# Patient Record
Sex: Female | Born: 1995 | Race: White | Hispanic: No | Marital: Single | Smoking: Never smoker
Health system: Southern US, Community
[De-identification: ages and names within clinical notes are randomized; demographics above are authoritative.]

## PROBLEM LIST (undated history)

## (undated) ENCOUNTER — Inpatient Hospital Stay: Payer: Self-pay

## (undated) DIAGNOSIS — D649 Anemia, unspecified: Secondary | ICD-10-CM

## (undated) DIAGNOSIS — F419 Anxiety disorder, unspecified: Secondary | ICD-10-CM

## (undated) DIAGNOSIS — F410 Panic disorder [episodic paroxysmal anxiety] without agoraphobia: Secondary | ICD-10-CM

## (undated) DIAGNOSIS — F1290 Cannabis use, unspecified, uncomplicated: Secondary | ICD-10-CM

## (undated) HISTORY — DX: Anxiety disorder, unspecified: F41.9

## (undated) HISTORY — DX: Panic disorder (episodic paroxysmal anxiety): F41.0

## (undated) HISTORY — PX: APPENDECTOMY: SHX54

## (undated) NOTE — *Deleted (*Deleted)
I value your feedback and entrusting us with your care. If you get a [facility] patient survey, I would appreciate you taking the time to let us know about your experience today. Thank you!  As of October 08, 2019, your lab results will be released to your MyChart immediately, before I even have a chance to see them. Please give me time to review them and contact you if there are any abnormalities. Thank you for your patience.  

---
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
## 2016-01-12 ENCOUNTER — Inpatient Hospital Stay: Payer: Medicaid Other | Admitting: Anesthesiology

## 2016-01-12 ENCOUNTER — Inpatient Hospital Stay
Admission: EM | Admit: 2016-01-12 | Discharge: 2016-01-14 | DRG: 774 | Disposition: A | Discharge status: Home / Self Care | Payer: Medicaid Other | Attending: Obstetrics and Gynecology | Admitting: Obstetrics and Gynecology

## 2016-01-12 ENCOUNTER — Encounter: Payer: Self-pay | Admitting: *Deleted

## 2016-01-12 DIAGNOSIS — O360930 Maternal care for other rhesus isoimmunization, third trimester, not applicable or unspecified: Secondary | ICD-10-CM | POA: Diagnosis present

## 2016-01-12 DIAGNOSIS — O9912 Other diseases of the blood and blood-forming organs and certain disorders involving the immune mechanism complicating childbirth: Secondary | ICD-10-CM | POA: Diagnosis present

## 2016-01-12 DIAGNOSIS — F1290 Cannabis use, unspecified, uncomplicated: Secondary | ICD-10-CM | POA: Diagnosis present

## 2016-01-12 DIAGNOSIS — O9822 Gonorrhea complicating childbirth: Secondary | ICD-10-CM | POA: Diagnosis present

## 2016-01-12 DIAGNOSIS — O99324 Drug use complicating childbirth: Secondary | ICD-10-CM | POA: Diagnosis present

## 2016-01-12 DIAGNOSIS — D62 Acute posthemorrhagic anemia: Secondary | ICD-10-CM | POA: Diagnosis not present

## 2016-01-12 DIAGNOSIS — O0933 Supervision of pregnancy with insufficient antenatal care, third trimester: Secondary | ICD-10-CM | POA: Diagnosis not present

## 2016-01-12 DIAGNOSIS — O9081 Anemia of the puerperium: Secondary | ICD-10-CM | POA: Diagnosis not present

## 2016-01-12 DIAGNOSIS — Z3A39 39 weeks gestation of pregnancy: Secondary | ICD-10-CM | POA: Diagnosis not present

## 2016-01-12 DIAGNOSIS — D6959 Other secondary thrombocytopenia: Secondary | ICD-10-CM | POA: Diagnosis present

## 2016-01-12 DIAGNOSIS — O98213 Gonorrhea complicating pregnancy, third trimester: Secondary | ICD-10-CM | POA: Diagnosis present

## 2016-01-12 LAB — CBC
HCT: 27.7 % — ABNORMAL LOW (ref 35.0–47.0)
Hemoglobin: 9.4 g/dL — ABNORMAL LOW (ref 12.0–16.0)
MCH: 28.7 pg (ref 26.0–34.0)
MCHC: 34.1 g/dL (ref 32.0–36.0)
MCV: 84.3 fL (ref 80.0–100.0)
Platelets: 123 10*3/uL — ABNORMAL LOW (ref 150–440)
RBC: 3.28 MIL/uL — ABNORMAL LOW (ref 3.80–5.20)
RDW: 13.3 % (ref 11.5–14.5)
WBC: 11.1 10*3/uL — ABNORMAL HIGH (ref 3.6–11.0)

## 2016-01-12 LAB — URINE DRUG SCREEN, QUALITATIVE (ARMC ONLY)
Amphetamines, Ur Screen: NOT DETECTED
Barbiturates, Ur Screen: NOT DETECTED
Benzodiazepine, Ur Scrn: NOT DETECTED
Cannabinoid 50 Ng, Ur ~~LOC~~: NOT DETECTED
Cocaine Metabolite,Ur ~~LOC~~: NOT DETECTED
MDMA (Ecstasy)Ur Screen: NOT DETECTED
Methadone Scn, Ur: NOT DETECTED
Opiate, Ur Screen: NOT DETECTED
Phencyclidine (PCP) Ur S: NOT DETECTED
Tricyclic, Ur Screen: NOT DETECTED

## 2016-01-12 LAB — CHLAMYDIA/NGC RT PCR (ARMC ONLY)
Chlamydia Tr: NOT DETECTED
N gonorrhoeae: DETECTED — AB

## 2016-01-12 MED ORDER — TERBUTALINE SULFATE 1 MG/ML IJ SOLN: 0.2500 mg | Freq: Once | INTRAMUSCULAR | Status: DC | PRN

## 2016-01-12 MED ORDER — OXYTOCIN 10 UNIT/ML IJ SOLN
INTRAMUSCULAR | Status: AC
  Filled 2016-01-12: qty 2

## 2016-01-12 MED ORDER — LIDOCAINE HCL (PF) 1 % IJ SOLN
INTRAMUSCULAR | Status: AC
  Filled 2016-01-12: qty 30

## 2016-01-12 MED ORDER — BUPIVACAINE HCL (PF) 0.25 % IJ SOLN
INTRAMUSCULAR | Status: DC | PRN
  Administered 2016-01-12: 5 mL via EPIDURAL

## 2016-01-12 MED ORDER — LACTATED RINGERS IV SOLN
500.0000 mL | INTRAVENOUS | Status: DC | PRN
  Administered 2016-01-12: 500 mL via INTRAVENOUS

## 2016-01-12 MED ORDER — BUTORPHANOL TARTRATE 1 MG/ML IJ SOLN
1.0000 mg | INTRAMUSCULAR | Status: DC | PRN
  Administered 2016-01-12: 1 mg via INTRAVENOUS
  Filled 2016-01-12: qty 1

## 2016-01-12 MED ORDER — OXYTOCIN 40 UNITS IN LACTATED RINGERS INFUSION - SIMPLE MED
2.5000 [IU]/h | INTRAVENOUS | Status: DC
  Filled 2016-01-12: qty 1000

## 2016-01-12 MED ORDER — ONDANSETRON HCL 4 MG/2ML IJ SOLN: 4.0000 mg | Freq: Four times a day (QID) | INTRAMUSCULAR | Status: DC | PRN

## 2016-01-12 MED ORDER — SODIUM CHLORIDE FLUSH 0.9 % IV SOLN
INTRAVENOUS | Status: AC
  Filled 2016-01-12: qty 10

## 2016-01-12 MED ORDER — LACTATED RINGERS IV SOLN
INTRAVENOUS | Status: DC
  Administered 2016-01-12: 17:00:00 via INTRAVENOUS

## 2016-01-12 MED ORDER — OXYTOCIN 40 UNITS IN LACTATED RINGERS INFUSION - SIMPLE MED
1.0000 m[IU]/min | INTRAVENOUS | Status: DC
  Administered 2016-01-12: 1 m[IU]/min via INTRAVENOUS

## 2016-01-12 MED ORDER — OXYTOCIN BOLUS FROM INFUSION
500.0000 mL | INTRAVENOUS | Status: DC
  Administered 2016-01-12: 500 mL via INTRAVENOUS

## 2016-01-12 MED ORDER — MISOPROSTOL 200 MCG PO TABS
ORAL_TABLET | ORAL | Status: AC
  Filled 2016-01-12: qty 4

## 2016-01-12 MED ORDER — FENTANYL 2.5 MCG/ML W/ROPIVACAINE 0.2% IN NS 100 ML EPIDURAL INFUSION (ARMC-ANES)
EPIDURAL | Status: AC
  Administered 2016-01-12: 10 mL/h via EPIDURAL
  Filled 2016-01-12: qty 100

## 2016-01-12 MED ORDER — CEFTRIAXONE SODIUM 250 MG IJ SOLR
250.0000 mg | Freq: Once | INTRAMUSCULAR | Status: AC
  Administered 2016-01-12: 250 mg via INTRAMUSCULAR
  Filled 2016-01-12: qty 250

## 2016-01-12 MED ORDER — AMMONIA AROMATIC IN INHA
RESPIRATORY_TRACT | Status: AC
  Filled 2016-01-12: qty 10

## 2016-01-12 MED ORDER — ACETAMINOPHEN 325 MG PO TABS
650.0000 mg | ORAL_TABLET | ORAL | Status: DC | PRN
Start: 2016-01-12 — End: 2016-01-13

## 2016-01-12 MED ORDER — AZITHROMYCIN 250 MG PO TABS
1000.0000 mg | ORAL_TABLET | Freq: Once | ORAL | Status: AC
  Administered 2016-01-12: 1000 mg via ORAL
  Filled 2016-01-12: qty 4

## 2016-01-12 MED ORDER — LIDOCAINE HCL (PF) 1 % IJ SOLN: 30.0000 mL | INTRAMUSCULAR | Status: DC | PRN

## 2016-01-12 NOTE — Discharge Instructions (Addendum)
° °Vaginal Delivery, Care After °Refer to this sheet in the next few weeks. These discharge instructions provide you with information on caring for yourself after delivery. Your caregiver may also give you specific instructions. Your treatment has been planned according to the most current medical practices available, but problems sometimes occur. Call your caregiver if you have any problems or questions after you go home. °HOME CARE INSTRUCTIONS °1. Take over-the-counter or prescription medicines only as directed by your caregiver or pharmacist. °2. Do not drink alcohol, especially if you are breastfeeding or taking medicine to relieve pain. °3. Do not smoke tobacco. °4. Continue to use good perineal care. Good perineal care includes: °1. Wiping your perineum from back to front °2. Keeping your perineum clean. °3. You can do sitz baths twice a day, to help keep this area clean °5. Do not use tampons, douche or have sex until your caregiver says it is okay. °6. Shower only and avoid sitting in submerged water, aside from sitz baths °7. Wear a well-fitting bra that provides breast support. °8. Eat healthy foods. °9. Drink enough fluids to keep your urine clear or pale yellow. °10. Eat high-fiber foods such as whole grain cereals and breads, brown rice, beans, and fresh fruits and vegetables every day. These foods may help prevent or relieve constipation. °11. Avoid constipation with high fiber foods or medications, such as miralax or metamucil °12. Follow your caregiver's recommendations regarding resumption of activities such as climbing stairs, driving, lifting, exercising, or traveling. °13. Talk to your caregiver about resuming sexual activities. Resumption of sexual activities is dependent upon your risk of infection, your rate of healing, and your comfort and desire to resume sexual activity. °14. Try to have someone help you with your household activities and your newborn for at least a few days after you  leave the hospital. °15. Rest as much as possible. Try to rest or take a nap when your newborn is sleeping. °16. Increase your activities gradually. °17. Keep all of your scheduled postpartum appointments. It is very important to keep your scheduled follow-up appointments. At these appointments, your caregiver will be checking to make sure that you are healing physically and emotionally. °SEEK MEDICAL CARE IF:  °· You are passing large clots from your vagina. Save any clots to show your caregiver. °· You have a foul smelling discharge from your vagina. °· You have trouble urinating. °· You are urinating frequently. °· You have pain when you urinate. °· You have a change in your bowel movements. °· You have increasing redness, pain, or swelling near your vaginal incision (episiotomy) or vaginal tear. °· You have pus draining from your episiotomy or vaginal tear. °· Your episiotomy or vaginal tear is separating. °· You have painful, hard, or reddened breasts. °· You have a severe headache. °· You have blurred vision or see spots. °· You feel sad or depressed. °· You have thoughts of hurting yourself or your newborn. °· You have questions about your care, the care of your newborn, or medicines. °· You are dizzy or light-headed. °· You have a rash. °· You have nausea or vomiting. °· You were breastfeeding and have not had a menstrual period within 12 weeks after you stopped breastfeeding. °· You are not breastfeeding and have not had a menstrual period by the 12th week after delivery. °· You have a fever. °SEEK IMMEDIATE MEDICAL CARE IF:  °· You have persistent pain. °· You have chest pain. °· You have shortness of breath. °·   You faint. °· You have leg pain. °· You have stomach pain. °· Your vaginal bleeding saturates two or more sanitary pads in 1 hour. °MAKE SURE YOU:  °· Understand these instructions. °· Will watch your condition. °· Will get help right away if you are not doing well or get worse. °Document Released:  10/12/2000 Document Revised: 03/01/2014 Document Reviewed: 06/11/2012 °ExitCare® Patient Information ©2015 [employer]. This information is not intended to replace advice given to you by your health care provider. Make sure you discuss any questions you have with your health care provider. ° °Sitz Bath °A sitz bath is a warm water bath taken in the sitting position. The water covers only the hips and butt (buttocks). We recommend using one that fits in the toilet, to help with ease of use and cleanliness. It may be used for either healing or cleaning purposes. Sitz baths are also used to relieve pain, itching, or muscle tightening (spasms). The water may contain medicine. Moist heat will help you heal and relax.  °HOME CARE  °Take 3 to 4 sitz baths a day. °18. Fill the bathtub half-full with warm water. °19. Sit in the water and open the drain a little. °20. Turn on the warm water to keep the tub half-full. Keep the water running constantly. °21. Soak in the water for 15 to 20 minutes. °22. After the sitz bath, pat the affected area dry. °GET HELP RIGHT AWAY IF: °You get worse instead of better. Stop the sitz baths if you get worse. °MAKE SURE YOU: °· Understand these instructions. °· Will watch your condition. °· Will get help right away if you are not doing well or get worse. °Document Released: 11/22/2004 Document Revised: 07/09/2012 Document Reviewed: 02/12/2011 °ExitCare® Patient Information ©2015 [employer]. This information is not intended to replace advice given to you by your health care provider. Make sure you discuss any questions you have with your health care provider. ° °Call your doctor for increased pain or vaginal bleeding, temperature above 100.4, depression, or concerns.  No strenuous activity or heavy lifting for 6 weeks.  No intercourse, tampons, douching, or enemas for 6 weeks.  No tub baths-showers only.  No driving for 2 weeks or while taking pain medications.  Continue prenatal vitamin  and iron.  Increase calories and fluids while breastfeeding.   °

## 2016-01-12 NOTE — OB Triage Note (Signed)
Pt. C/O SROM @ approx 0230/0300 this am. Reports clear fluid continuing to leak. Contractions started approx. 30 minutes after leaking of fluid began. Elaina HoopsElks, Shakira Los S

## 2016-01-12 NOTE — Discharge Summary (Signed)
Obstetrical Discharge Summary  Date of Admission: 01/12/2016 Date of Discharge: 01/14/2016  Primary OB: [facility]  Gestational Age at Delivery: [id number]   Antepartum complications: Rh negative, insufficient prenatal care, gonorrhea diagnosed on admission, history of THC use, anemia, gestational thrombocytopenia Reason for Admission: SROM  Date of Delivery: 01/12/2016  Delivered By: Cornelia Copaharlie Pickens, Jr MD Delivery Type: vacuum, outlet Intrapartum complications/course: late decelerations with pushing Anesthesia: epidural Placenta: Delivered and expressed via active management. Intact: yes. To pathology: yes.  Laceration: Perineum intact. Bilateral skid marks on inner labia near urethra; left one repaired with 3-0 plain gut Episiotomy: none Baby: Liveborn female, APGARs 8/9, weight 3230 g.   Discharge Diagnosis: Delivered. Same.  Postpartum course: Routine Discharge Vital Signs:  Current Vital Signs 24h Vital Sign Ranges  T 98.3 F (36.8 C) Temp  Avg: 98.4 F (36.9 C)  Min: 98.2 F (36.8 C)  Max: 98.7 F (37.1 C)  BP 116/82 mmHg BP  Min: 113/75  Max: 117/70  HR 68 Pulse  Avg: 80.4  Min: 68  Max: 94  RR 14 Resp  Avg: 17.6  Min: 14  Max: 20  SaO2 100 % Not Delivered SpO2  Avg: 100 %  Min: 100 %  Max: 100 %       24 Hour I/O Current Shift I/O  Time Ins Outs 03/17 0701 - 03/18 0700 In: 240 [P.O.:240] Out: 700 [Urine:700]     Patient Vitals for the past 6 hrs:  BP Temp Temp src Pulse Resp SpO2  01/14/16 0906 116/82 mmHg 98.3 F (36.8 C) Oral 68 14 100 %    Discharge Exam:  NAD Perineum: intact Abdomen: firm fundus below the umbilicus.  RRR no MRGs CTAB Ext: no c/c/e  Disposition: Home  Rh Immune globulin given: not applicable Rubella vaccine given: not applicable Tdap vaccine given in AP or PP setting: yes  Contraception: Depo  Prenatal/Postnatal Panel: O NEG//Rubella Immune//Varicella Immune//RPR negative//HIV negative/HepB Surface Ag negative//pap not applicable due  to age//plans to breastfeed  Plan:  Natasha Porter was discharged to home in good condition. Follow-up appointment with Dr. Vergie LivingPickens in 4 weeks for a regular PP visit  Discharge Medications:   Medication List    STOP taking these medications        acetaminophen 500 MG tablet  Commonly known as:  TYLENOL     amoxicillin 500 MG tablet  Commonly known as:  AMOXIL     metroNIDAZOLE 500 MG tablet  Commonly known as:  FLAGYL      TAKE these medications        famotidine 20 MG tablet  Commonly known as:  PEPCID  Take 1 tablet (20 mg total) by mouth 2 (two) times daily as needed for heartburn or indigestion.     ferrous fumarate 325 (106 Fe) MG Tabs tablet  Commonly known as:  HEMOCYTE - 106 mg FE  Take 1 tablet (106 mg of iron total) by mouth 2 (two) times daily.     ibuprofen 600 MG tablet  Commonly known as:  ADVIL,MOTRIN  Take 1 tablet (600 mg total) by mouth every 4 (four) hours as needed for moderate pain.     multivitamin-prenatal 27-0.8 MG Tabs tablet  Take 1 tablet by mouth daily at 12 noon.

## 2016-01-12 NOTE — Anesthesia Procedure Notes (Signed)
Epidural Patient location during procedure: OB  Staffing Anesthesiologist: Natasha AddisonHOMAS, Natasha Porter Performed by: anesthesiologist   Preanesthetic Checklist Completed: patient identified, site marked, surgical consent, pre-op evaluation, timeout performed, IV checked, risks and benefits discussed and monitors and equipment checked  Epidural Patient position: sitting Prep: Betadine Patient monitoring: heart rate, continuous pulse ox and blood pressure Approach: midline Location: L4-L5 Injection technique: LOR saline  Needle:  Needle type: Tuohy  Needle gauge: 18 G Needle length: 9 cm and 9 Catheter type: closed end flexible Catheter size: 20 Guage Test dose: negative and 1.5% lidocaine with Epi 1:200 K  Assessment Sensory level: T10 Events: blood not aspirated, injection not painful, no injection resistance, negative IV test and no paresthesia  Additional Notes   Patient tolerated the insertion well without complications.1819 catheter in. 1820 test dose. 1822 bolus. 1827 infusion start.Reason for block:procedure for pain

## 2016-01-12 NOTE — H&P (Signed)
OB History & Physical   History of Present Illness:  Chief Complaint: IUP at 39 weeks 1 day, contractions and spontaneous rupture of membranes this morning at 0245  HPI:  Natasha Porter is a 20 y.o. G1P0 female at 31102w1d dated by LMP and confirmed by U/S.  Her pregnancy has been complicated by late entry to prenatal care, marijuana use.    She reports contractions.   She reports leakage of fluid.   She denies vaginal bleeding.   She reports fetal movement.    Maternal Medical History:   Past Medical History  Diagnosis Date  . Anemia   . Marijuana use     Past Surgical History  Procedure Laterality Date  . Appendectomy      No Known Allergies  Prior to Admission medications   Medication Sig Start Date End Date Taking? Authorizing Provider  acetaminophen (TYLENOL) 500 MG tablet Take 2 tablets (1,000 mg total) by mouth every 6 (six) hours as needed for fever or headache. 12/21/15   Elenora Fenderhelsea C Ward, MD  amoxicillin (AMOXIL) 500 MG tablet Take 500 mg by mouth 3 (three) times daily. Reported on 01/12/2016    Historical Provider, MD  famotidine (PEPCID) 20 MG tablet Take 1 tablet (20 mg total) by mouth 2 (two) times daily as needed for heartburn or indigestion. Patient not taking: Reported on 01/12/2016 12/21/15   Elenora Fenderhelsea C Ward, MD  ferrous fumarate (HEMOCYTE - 106 MG FE) 325 (106 Fe) MG TABS tablet Take 1 tablet (106 mg of iron total) by mouth 2 (two) times daily. 12/21/15   Chelsea C Ward, MD  metroNIDAZOLE (FLAGYL) 500 MG tablet Take 1 tablet (500 mg total) by mouth 2 (two) times daily. Patient not taking: Reported on 01/12/2016 12/21/15   Elenora Fenderhelsea C Ward, MD  Prenatal Vit-Fe Fumarate-FA (MULTIVITAMIN-PRENATAL) 27-0.8 MG TABS tablet Take 1 tablet by mouth daily at 12 noon.    Historical Provider, MD    OB History  Gravida Para Term Preterm AB SAB TAB Ectopic Multiple Living  1             # Outcome Date GA Lbr Len/2nd Weight Sex Delivery Anes PTL Lv  1 Current               Prenatal  care site: [facility]  Social History: She  reports that she has never smoked. She does not have any smokeless tobacco history on file. She reports that she uses illicit drugs (Marijuana). She reports that she does not drink alcohol.  Family History: family history is not on file.   Review of Systems: Negative x 10 systems reviewed except as noted in the HPI.    Physical Exam:  Vital Signs: BP 125/81 mmHg  Pulse 80  Temp(Src) 98.1 F (36.7 C) (Oral)  Resp 16  Ht 5\' 3"  (1.6 m)  Wt 69.854 kg (154 lb)  BMI 27.29 kg/m2 General: no acute distress.  HEENT: normocephalic, atraumatic Heart: regular rate & rhythm.  No murmurs/rubs/gallops Lungs: clear to auscultation bilaterally Abdomen: soft, gravid, non-tender;  EFW: 7 pounds Pelvic:   External: Normal external female genitalia  Cervix: Dilation: 3 / Effacement (%): 90 / Station: -1   ROM: + pooling; - nitrazine, fetal hair visualized at cervix Extremities: non-tender, symmetric, no edema bilaterally.  DTRs: 2+ Neurologic: Alert & oriented x 3.    Pertinent Results:  Prenatal Labs: Blood type/Rh O negative  Antibody screen negative  Rubella Immune  Varicella Immune    RPR Non  reactive  HBsAg negative  HIV negative  GC negative  Chlamydia negative  Genetic screening declined  1 hour GTT 126  3 hour GTT NA  GBS negative on 12/21/2015   Baseline FHR: 150 beats/min   Variability: moderate   Accelerations: present   Decelerations: absent Contractions: present frequency: every 3 minutes per pt report Overall assessment: Category I tracing   Assessment:  Natasha Porter is a 20 y.o. G1P0 female at [redacted]w[redacted]d with contractions and rupture of membranes.   Plan:  1. Admit to Labor & Delivery  2. CBC, T&S, UDS, Clrs, IVF 3. GBS negative.   4. Fetal well-being: Category I 5. Augment labor with pitocin 6. Epidural as desired  Anikin Prosser, CNM  This patient and plan were discussed with Dr Vergie Living 01/12/2016
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
## 2016-01-13 LAB — CBC
HCT: 21.9 % — ABNORMAL LOW (ref 35.0–47.0)
HCT: 21.7 % — ABNORMAL LOW (ref 35.0–47.0)
Hemoglobin: 7.5 g/dL — ABNORMAL LOW (ref 12.0–16.0)
Hemoglobin: 7.3 g/dL — ABNORMAL LOW (ref 12.0–16.0)
MCH: 28.6 pg (ref 26.0–34.0)
MCH: 28.6 pg (ref 26.0–34.0)
MCHC: 34.2 g/dL (ref 32.0–36.0)
MCHC: 33.6 g/dL (ref 32.0–36.0)
MCV: 83.5 fL (ref 80.0–100.0)
MCV: 85 fL (ref 80.0–100.0)
Platelets: 126 10*3/uL — ABNORMAL LOW (ref 150–440)
Platelets: 121 10*3/uL — ABNORMAL LOW (ref 150–440)
RBC: 2.62 MIL/uL — ABNORMAL LOW (ref 3.80–5.20)
RBC: 2.55 MIL/uL — ABNORMAL LOW (ref 3.80–5.20)
RDW: 13.2 % (ref 11.5–14.5)
RDW: 13.6 % (ref 11.5–14.5)
WBC: 16.4 10*3/uL — ABNORMAL HIGH (ref 3.6–11.0)
WBC: 12.6 10*3/uL — ABNORMAL HIGH (ref 3.6–11.0)

## 2016-01-13 LAB — TYPE AND SCREEN
ABO/RH(D): O NEG
Antibody Screen: POSITIVE

## 2016-01-13 LAB — FETAL SCREEN: Fetal Screen: NEGATIVE

## 2016-01-13 LAB — RPR: RPR Ser Ql: NONREACTIVE

## 2016-01-13 LAB — ABO/RH: ABO/RH(D): O NEG

## 2016-01-13 MED ORDER — MEDROXYPROGESTERONE ACETATE 150 MG/ML IM SUSP
150.0000 mg | Freq: Once | INTRAMUSCULAR | Status: AC
  Administered 2016-01-14: 150 mg via INTRAMUSCULAR
  Filled 2016-01-13: qty 1

## 2016-01-13 MED ORDER — DOCUSATE SODIUM 100 MG PO CAPS: 100.0000 mg | ORAL_CAPSULE | Freq: Two times a day (BID) | ORAL | Status: DC | PRN

## 2016-01-13 MED ORDER — ACETAMINOPHEN 325 MG PO TABS: 650.0000 mg | ORAL_TABLET | ORAL | Status: DC | PRN

## 2016-01-13 MED ORDER — SENNOSIDES-DOCUSATE SODIUM 8.6-50 MG PO TABS: 1.0000 | ORAL_TABLET | Freq: Every evening | ORAL | Status: DC | PRN

## 2016-01-13 MED ORDER — IBUPROFEN 600 MG PO TABS
600.0000 mg | ORAL_TABLET | Freq: Four times a day (QID) | ORAL | Status: DC
  Administered 2016-01-13 – 2016-01-14 (×5): 600 mg via ORAL
  Filled 2016-01-13 (×6): qty 1

## 2016-01-13 MED ORDER — PRENATAL MULTIVITAMIN CH
1.0000 | ORAL_TABLET | Freq: Every day | ORAL | Status: DC
  Administered 2016-01-14: 1 via ORAL
  Filled 2016-01-13: qty 1

## 2016-01-13 MED ORDER — OXYCODONE HCL 5 MG PO TABS: 5.0000 mg | ORAL_TABLET | Freq: Four times a day (QID) | ORAL | Status: DC | PRN

## 2016-01-13 MED ORDER — WITCH HAZEL-GLYCERIN EX PADS: 1.0000 "application " | MEDICATED_PAD | CUTANEOUS | Status: DC | PRN

## 2016-01-13 MED ORDER — ONDANSETRON HCL 4 MG PO TABS: 4.0000 mg | ORAL_TABLET | ORAL | Status: DC | PRN

## 2016-01-13 MED ORDER — OXYTOCIN 10 UNIT/ML IJ SOLN
2.5000 [IU]/h | INTRAMUSCULAR | Status: DC | PRN
  Filled 2016-01-13: qty 10

## 2016-01-13 MED ORDER — ONDANSETRON HCL 4 MG/2ML IJ SOLN: 4.0000 mg | INTRAMUSCULAR | Status: DC | PRN

## 2016-01-13 MED ORDER — BENZOCAINE-MENTHOL 20-0.5 % EX AERO
1.0000 "application " | INHALATION_SPRAY | CUTANEOUS | Status: DC | PRN
  Filled 2016-01-13: qty 56

## 2016-01-13 MED ORDER — TETANUS-DIPHTH-ACELL PERTUSSIS 5-2.5-18.5 LF-MCG/0.5 IM SUSP: 0.5000 mL | Freq: Once | INTRAMUSCULAR | Status: DC

## 2016-01-13 MED ORDER — RHO D IMMUNE GLOBULIN 1500 UNIT/2ML IJ SOSY
300.0000 ug | PREFILLED_SYRINGE | Freq: Once | INTRAMUSCULAR | Status: AC
  Administered 2016-01-13: 300 ug via INTRAVENOUS
  Filled 2016-01-13: qty 2

## 2016-01-13 MED ORDER — SIMETHICONE 80 MG PO CHEW: 80.0000 mg | CHEWABLE_TABLET | ORAL | Status: DC | PRN

## 2016-01-13 MED ORDER — DIPHENHYDRAMINE HCL 25 MG PO CAPS: 25.0000 mg | ORAL_CAPSULE | Freq: Four times a day (QID) | ORAL | Status: DC | PRN

## 2016-01-13 MED ORDER — DIBUCAINE 1 % RE OINT: 1.0000 "application " | TOPICAL_OINTMENT | RECTAL | Status: DC | PRN

## 2016-01-13 MED ORDER — OXYCODONE HCL 5 MG PO TABS: 10.0000 mg | ORAL_TABLET | Freq: Four times a day (QID) | ORAL | Status: DC | PRN

## 2016-01-13 MED ORDER — LANOLIN HYDROUS EX OINT: TOPICAL_OINTMENT | CUTANEOUS | Status: DC | PRN

## 2016-01-13 NOTE — Anesthesia Postprocedure Evaluation (Signed)
Anesthesia Post Note  Patient: Joen Lauraaylor N Engelson  Procedure(s) Performed: * No procedures listed *  Patient location during evaluation: Mother Baby Anesthesia Type: Epidural Level of consciousness: awake, oriented and awake and alert Pain management: pain level controlled Vital Signs Assessment: post-procedure vital signs reviewed and stable Respiratory status: spontaneous breathing and nonlabored ventilation Cardiovascular status: stable Postop Assessment: no headache, no backache and no signs of nausea or vomiting Anesthetic complications: no    Last Vitals:  Filed Vitals:   01/13/16 0456 01/13/16 0700  BP: 119/66 107/65  Pulse: 78 83  Temp: 36.8 C 36.6 C  Resp:  20    Last Pain:  Filed Vitals:   01/13/16 0736  PainSc: 0-No pain                 Casey Burkitthuy Alisi Lupien

## 2016-01-13 NOTE — Progress Notes (Signed)
Subjective:  Doing well, no concerns. Appropriate locia < menses.  Denies feeling lightheaded, SOB.    Objective:   Filed Vitals:   01/12/16 2330 01/13/16 0142 01/13/16 0456 01/13/16 0700  BP: 125/67 121/68 119/66 107/65  Pulse: 91 105 78 83  Temp:  99 F (37.2 C) 98.2 F (36.8 C) 97.8 F (36.6 C)  TempSrc:  Oral Oral Oral  Resp:  18  20  Height:      Weight:      SpO2:  100% 99%    General: NAD Pulmonary: no increased work of breathing Abdomen: non-distended, non-tender, fundus firm at level of umbilicus Extremities: no edema, no erythema, no tenderness  Results for orders placed or performed during the hospital encounter of 01/12/16 (from the past 72 hour(s))  CBC     Status: Abnormal   Collection Time: 01/12/16 10:44 AM  Result Value Ref Range   WBC 11.1 (H) 3.6 - 11.0 K/uL   RBC 3.28 (L) 3.80 - 5.20 MIL/uL   Hemoglobin 9.4 (L) 12.0 - 16.0 g/dL   HCT 09.827.7 (L) 11.935.0 - 14.747.0 %   MCV 84.3 80.0 - 100.0 fL   MCH 28.7 26.0 - 34.0 pg   MCHC 34.1 32.0 - 36.0 g/dL   RDW 82.913.3 56.211.5 - 13.014.5 %   Platelets 123 (L) 150 - 440 K/uL  Type and screen [facility]     Status: None   Collection Time: 01/12/16 10:44 AM  Result Value Ref Range   ABO/RH(D) O NEG    Antibody Screen POS    Sample Expiration 01/15/2016    Antibody Identification PASSIVELY ACQUIRED ANTI-D   RPR     Status: None   Collection Time: 01/12/16 10:44 AM  Result Value Ref Range   RPR Ser Ql Non Reactive Non Reactive    Comment: (NOTE) Performed At: [facility] [address] [place], [place] [id number] Mila HomerHancock William F MD EX:[id number]:[phone]   ABO/Rh     Status: None   Collection Time: 01/12/16 10:45 AM  Result Value Ref Range   ABO/RH(D) O NEG   Chlamydia/NGC rt PCR ([facility] only)     Status: Abnormal   Collection Time: 01/12/16 10:57 AM  Result Value Ref Range   Specimen source GC/Chlam URINE, RANDOM    Chlamydia Tr NOT DETECTED NOT DETECTED   N gonorrhoeae DETECTED (A) NOT DETECTED     Comment: (NOTE) 100  This methodology has not been evaluated in pregnant women or in 200  patients with a history of hysterectomy. 300 400  This methodology will not be performed on patients less than 2614  years of age.   Urine Drug Screen, Qualitative ([facility] only)     Status: None   Collection Time: 01/12/16 10:57 AM  Result Value Ref Range   Tricyclic, Ur Screen NONE DETECTED NONE DETECTED   Amphetamines, Ur Screen NONE DETECTED NONE DETECTED   MDMA (Ecstasy)Ur Screen NONE DETECTED NONE DETECTED   Cocaine Metabolite,Ur [place] NONE DETECTED NONE DETECTED   Opiate, Ur Screen NONE DETECTED NONE DETECTED   Phencyclidine (PCP) Ur S NONE DETECTED NONE DETECTED   Cannabinoid 50 Ng, Ur [place] NONE DETECTED NONE DETECTED   Barbiturates, Ur Screen NONE DETECTED NONE DETECTED   Benzodiazepine, Ur Scrn NONE DETECTED NONE DETECTED   Methadone Scn, Ur NONE DETECTED NONE DETECTED    Comment: (NOTE) 100  Tricyclics, urine               Cutoff 1000 ng/mL 200  Amphetamines, urine             Cutoff 1000 ng/mL 300  MDMA (Ecstasy), urine           Cutoff 500 ng/mL 400  Cocaine Metabolite, urine       Cutoff 300 ng/mL 500  Opiate, urine                   Cutoff 300 ng/mL 600  Phencyclidine (PCP), urine      Cutoff 25 ng/mL 700  Cannabinoid, urine              Cutoff 50 ng/mL 800  Barbiturates, urine             Cutoff 200 ng/mL 900  Benzodiazepine, urine           Cutoff 200 ng/mL 1000 Methadone, urine                Cutoff 300 ng/mL 1100 1200 The urine drug screen provides only a preliminary, unconfirmed 1300 analytical test result and should not be used for non-medical 1400 purposes. Clinical consideration and professional judgment should 1500 be applied to any positive drug screen result due to possible 1600 interfering substances. A more specific alternate chemical method 1700 must be used in order to obtain a confirmed analytical result.  1800 Gas chromato graphy / mass spectrometry (GC/MS) is the  preferred 1900 confirmatory method.   CBC     Status: Abnormal   Collection Time: 01/13/16  5:17 AM  Result Value Ref Range   WBC 16.4 (H) 3.6 - 11.0 K/uL   RBC 2.62 (L) 3.80 - 5.20 MIL/uL   Hemoglobin 7.5 (L) 12.0 - 16.0 g/dL   HCT 95.6 (L) 21.3 - 08.6 %   MCV 83.5 80.0 - 100.0 fL   MCH 28.6 26.0 - 34.0 pg   MCHC 34.2 32.0 - 36.0 g/dL   RDW 57.8 46.9 - 62.9 %   Platelets 126 (L) 150 - 440 K/uL   Information for the patient's newborn:  Meleah, Demeyer [[id number]]  O POS   Assessment:   20 y.o. G1P1001 postpartum day # 1 TSVD  Plan:    1) Acute blood loss anemia - hemodynamically stable and asymptomatic - po ferrous sulfate  2) --/--/O NEG (03/16 1045) Ishmael Holter Immune (10/07 0000) / Varicella Immune - infant Rh positive rhogam indicated  3) TDAP status 11/18/2015, states received influenza vaccination this season  4) Breast/Depo  5) Gonorrhea - s/p rocephin and azithromycin  6) Gestational thrombocytopenia - platelets stable 123K to 126k postpartum  7) History of positive UDS this pregnancy - negative on admission  8) Disposition - anticipate discharge PPD2

## 2016-01-13 NOTE — Progress Notes (Signed)
RN to the bedside to assist with ambulating pt. to BR for post delivery void. Peri care given, pt. Unable to void, clean pad in place.  Steady gait. FOB at the bedside, supportive. Pt. Ready for transfer to postpartum unit.

## 2016-01-14 LAB — RHOGAM INJECTION: Unit division: 0

## 2016-01-14 MED ORDER — IBUPROFEN 600 MG PO TABS: 600.0000 mg | ORAL_TABLET | ORAL | Status: DC | PRN

## 2016-01-14 NOTE — Progress Notes (Signed)
Subjective:  Doing well, no concerns. Appropriate locia < menses.    Objective:   Filed Vitals:   01/13/16 1512 01/13/16 1945 01/14/16 0025 01/14/16 0906  BP: 117/82 117/70 113/75 116/82  Pulse: 91 94 71 68  Temp: 98.2 F (36.8 C) 98.2 F (36.8 C) 98.4 F (36.9 C) 98.3 F (36.8 C)  TempSrc:  Oral Oral Oral  Resp: Height:      Weight:      SpO2:  100% 100% 100%   General: NAD Pulmonary: no increased work of breathing Abdomen: non-distended, non-tender, fundus firm at level of umbilicus Extremities: no edema, no erythema, no tenderness  Results for orders placed or performed during the hospital encounter of 01/12/16 (from the past 72 hour(s))  CBC     Status: Abnormal   Collection Time: 01/12/16 10:44 AM  Result Value Ref Range   WBC 11.1 (H) 3.6 - 11.0 K/uL   RBC 3.28 (L) 3.80 - 5.20 MIL/uL   Hemoglobin 9.4 (L) 12.0 - 16.0 g/dL   HCT 16.1 (L) 09.6 - 04.5 %   MCV 84.3 80.0 - 100.0 fL   MCH 28.7 26.0 - 34.0 pg   MCHC 34.1 32.0 - 36.0 g/dL   RDW 40.9 81.1 - 91.4 %   Platelets 123 (L) 150 - 440 K/uL  Type and screen [facility]     Status: None   Collection Time: 01/12/16 10:44 AM  Result Value Ref Range   ABO/RH(D) O NEG    Antibody Screen POS    Sample Expiration 01/15/2016    Antibody Identification PASSIVELY ACQUIRED ANTI-D   RPR     Status: None   Collection Time: 01/12/16 10:44 AM  Result Value Ref Range   RPR Ser Ql Non Reactive Non Reactive    Comment: (NOTE) Performed At: [facility] [address] [place], [place] [id number] Mila Homer MD [id number]   ABO/Rh     Status: None   Collection Time: 01/12/16 10:45 AM  Result Value Ref Range   ABO/RH(D) O NEG   Chlamydia/NGC rt PCR ([facility] only)     Status: Abnormal   Collection Time: 01/12/16 10:57 AM  Result Value Ref Range   Specimen source GC/Chlam URINE, RANDOM    Chlamydia Tr NOT DETECTED NOT DETECTED   N gonorrhoeae DETECTED (A) NOT DETECTED   Comment: (NOTE) 100  This methodology has not been evaluated in pregnant women or in 200  patients with a history of hysterectomy. 300 400  This methodology will not be performed on patients less than 43  years of age.   Urine Drug Screen, Qualitative ([facility] only)     Status: None   Collection Time: 01/12/16 10:57 AM  Result Value Ref Range   Tricyclic, Ur Screen NONE DETECTED NONE DETECTED   Amphetamines, Ur Screen NONE DETECTED NONE DETECTED   MDMA (Ecstasy)Ur Screen NONE DETECTED NONE DETECTED   Cocaine Metabolite,Ur [place] NONE DETECTED NONE DETECTED   Opiate, Ur Screen NONE DETECTED NONE DETECTED   Phencyclidine (PCP) Ur S NONE DETECTED NONE DETECTED   Cannabinoid 50 Ng, Ur [place] NONE DETECTED NONE DETECTED   Barbiturates, Ur Screen NONE DETECTED NONE DETECTED   Benzodiazepine, Ur Scrn NONE DETECTED NONE DETECTED   Methadone Scn, Ur NONE DETECTED NONE DETECTED    Comment: (NOTE) 100  Tricyclics, urine               Cutoff 1000 ng/mL 200  Amphetamines, urine  Cutoff 1000 ng/mL 300  MDMA (Ecstasy), urine           Cutoff 500 ng/mL 400  Cocaine Metabolite, urine       Cutoff 300 ng/mL 500  Opiate, urine                   Cutoff 300 ng/mL 600  Phencyclidine (PCP), urine      Cutoff 25 ng/mL 700  Cannabinoid, urine              Cutoff 50 ng/mL 800  Barbiturates, urine             Cutoff 200 ng/mL 900  Benzodiazepine, urine           Cutoff 200 ng/mL 1000 Methadone, urine                Cutoff 300 ng/mL 1100 1200 The urine drug screen provides only a preliminary, unconfirmed 1300 analytical test result and should not be used for non-medical 1400 purposes. Clinical consideration and professional judgment should 1500 be applied to any positive drug screen result due to possible 1600 interfering substances. A more specific alternate chemical method 1700 must be used in order to obtain a confirmed analytical result.  1800 Gas chromato graphy / mass spectrometry (GC/MS) is the  preferred 1900 confirmatory method.   Fetal screen     Status: None   Collection Time: 01/13/16  5:17 AM  Result Value Ref Range   Fetal Screen NEG   Rhogam injection     Status: None (Preliminary result)   Collection Time: 01/13/16  5:17 AM  Result Value Ref Range   Unit Number [id number]    Blood Component Type RHIG    Unit division 00    Status of Unit ISSUED    Transfusion Status OK TO TRANSFUSE   CBC     Status: Abnormal   Collection Time: 01/13/16  5:17 AM  Result Value Ref Range   WBC 16.4 (H) 3.6 - 11.0 K/uL   RBC 2.62 (L) 3.80 - 5.20 MIL/uL   Hemoglobin 7.5 (L) 12.0 - 16.0 g/dL   HCT 82.921.9 (L) 56.235.0 - 13.047.0 %   MCV 83.5 80.0 - 100.0 fL   MCH 28.6 26.0 - 34.0 pg   MCHC 34.2 32.0 - 36.0 g/dL   RDW 86.513.2 78.411.5 - 69.614.5 %   Platelets 126 (L) 150 - 440 K/uL  CBC     Status: Abnormal   Collection Time: 01/13/16  3:00 PM  Result Value Ref Range   WBC 12.6 (H) 3.6 - 11.0 K/uL   RBC 2.55 (L) 3.80 - 5.20 MIL/uL   Hemoglobin 7.3 (L) 12.0 - 16.0 g/dL   HCT 29.521.7 (L) 28.435.0 - 13.247.0 %   MCV 85.0 80.0 - 100.0 fL   MCH 28.6 26.0 - 34.0 pg   MCHC 33.6 32.0 - 36.0 g/dL   RDW 44.013.6 10.211.5 - 72.514.5 %   Platelets 121 (L) 150 - 440 K/uL   Information for the patient's newborn:  Bernard Desanctisayne, Boy Makira [[id number]][[id number]]  O POS    Assessment:   20 y.o. G1P1001 postpartum day # 1 TSVD  Plan:    1) Acute blood loss anemia - hemodynamically stable and asymptomatic - po ferrous sulfate  2) --/--/O NEG (03/16 1045) Ishmael Holter/Rubella Immune (10/07 0000) / Varicella Immune - infant Rh positive rhogam indicated  3) TDAP status 11/18/2015, states received influenza vaccination this season  4) Breast/Depo  5) Gonorrhea - s/p rocephin and  azithromycin  6) Gestational thrombocytopenia - platelets stable 123K to 126k postpartum  7) History of positive UDS this pregnancy - negative on admission  8) Disposition - d/c

## 2016-01-14 NOTE — Progress Notes (Signed)
Discharge instructions provided.  Pt verbalizes understanding of all instructions and follow-up care.  Pt discharged to home with infant at 1725 on 01/14/16 via wheelchair by RN. Reynold BowenSusan Paisley Novak Stgermaine, RN 01/14/2016 8:12 PM

## 2016-01-14 NOTE — Progress Notes (Signed)
Pt states that she received TDaP and Influenza vaccines during pregnancy.  Pt declines vaccines at this time. Reynold BowenSusan Paisley Kristyanna Barcelo, RN 01/14/2016 1:39 PM

## 2016-01-16 LAB — SURGICAL PATHOLOGY
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
## 2016-07-31 ENCOUNTER — Encounter (HOSPITAL_COMMUNITY): Payer: Self-pay | Admitting: Emergency Medicine

## 2016-07-31 ENCOUNTER — Emergency Department (HOSPITAL_COMMUNITY): Payer: No Typology Code available for payment source

## 2016-07-31 ENCOUNTER — Emergency Department (HOSPITAL_COMMUNITY)
Admission: EM | Admit: 2016-07-31 | Discharge: 2016-08-01 | Disposition: A | Discharge status: Home / Self Care | Payer: No Typology Code available for payment source | Attending: Emergency Medicine | Admitting: Emergency Medicine

## 2016-07-31 DIAGNOSIS — Y999 Unspecified external cause status: Secondary | ICD-10-CM | POA: Insufficient documentation

## 2016-07-31 DIAGNOSIS — S7001XA Contusion of right hip, initial encounter: Secondary | ICD-10-CM | POA: Insufficient documentation

## 2016-07-31 DIAGNOSIS — S92405A Nondisplaced unspecified fracture of left great toe, initial encounter for closed fracture: Secondary | ICD-10-CM

## 2016-07-31 DIAGNOSIS — Y939 Activity, unspecified: Secondary | ICD-10-CM | POA: Insufficient documentation

## 2016-07-31 DIAGNOSIS — S92515A Nondisplaced fracture of proximal phalanx of left lesser toe(s), initial encounter for closed fracture: Secondary | ICD-10-CM | POA: Diagnosis not present

## 2016-07-31 DIAGNOSIS — S7011XA Contusion of right thigh, initial encounter: Secondary | ICD-10-CM | POA: Insufficient documentation

## 2016-07-31 DIAGNOSIS — S20211A Contusion of right front wall of thorax, initial encounter: Secondary | ICD-10-CM | POA: Diagnosis not present

## 2016-07-31 DIAGNOSIS — S161XXA Strain of muscle, fascia and tendon at neck level, initial encounter: Secondary | ICD-10-CM | POA: Diagnosis not present

## 2016-07-31 DIAGNOSIS — S99922A Unspecified injury of left foot, initial encounter: Secondary | ICD-10-CM | POA: Diagnosis present

## 2016-07-31 DIAGNOSIS — Y92410 Unspecified street and highway as the place of occurrence of the external cause: Secondary | ICD-10-CM | POA: Diagnosis not present

## 2016-07-31 DIAGNOSIS — S40011A Contusion of right shoulder, initial encounter: Secondary | ICD-10-CM | POA: Diagnosis not present

## 2016-07-31 LAB — URINE MICROSCOPIC-ADD ON

## 2016-07-31 LAB — URINALYSIS, ROUTINE W REFLEX MICROSCOPIC
Bilirubin Urine: NEGATIVE
Glucose, UA: NEGATIVE mg/dL
Ketones, ur: NEGATIVE mg/dL
Nitrite: NEGATIVE
Protein, ur: 100 mg/dL — AB
Specific Gravity, Urine: 1.024 (ref 1.005–1.030)
pH: 6 (ref 5.0–8.0)

## 2016-07-31 LAB — PREGNANCY, URINE: Preg Test, Ur: NEGATIVE

## 2016-07-31 MED ORDER — MORPHINE SULFATE (PF) 4 MG/ML IV SOLN
4.0000 mg | Freq: Once | INTRAVENOUS | Status: AC
Start: 2016-07-31 — End: 2016-07-31
  Administered 2016-07-31: 4 mg via INTRAMUSCULAR
  Filled 2016-07-31: qty 1

## 2016-07-31 MED ORDER — ONDANSETRON 4 MG PO TBDP
4.0000 mg | ORAL_TABLET | Freq: Once | ORAL | Status: AC
  Administered 2016-07-31: 4 mg via ORAL
  Filled 2016-07-31: qty 1

## 2016-07-31 MED ORDER — KETOROLAC TROMETHAMINE 30 MG/ML IJ SOLN
30.0000 mg | Freq: Once | INTRAMUSCULAR | Status: AC
  Administered 2016-08-01: 30 mg via INTRAMUSCULAR
  Filled 2016-07-31: qty 1
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
## 2016-08-01 DIAGNOSIS — S92515A Nondisplaced fracture of proximal phalanx of left lesser toe(s), initial encounter for closed fracture: Secondary | ICD-10-CM | POA: Diagnosis not present

## 2016-08-01 MED ORDER — IBUPROFEN 600 MG PO TABS: 600.0000 mg | ORAL_TABLET | Freq: Four times a day (QID) | ORAL | 0 refills | Status: DC | PRN

## 2016-08-01 MED ORDER — HYDROCODONE-ACETAMINOPHEN 5-325 MG PO TABS: 1.0000 | ORAL_TABLET | ORAL | 0 refills | Status: DC | PRN
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
## 2016-08-01 NOTE — ED Notes (Signed)
Pt departed in NAD.  

## 2016-09-14 ENCOUNTER — Encounter (HOSPITAL_COMMUNITY): Payer: Self-pay | Admitting: Emergency Medicine

## 2016-09-14 ENCOUNTER — Emergency Department (HOSPITAL_COMMUNITY)
Admission: EM | Admit: 2016-09-14 | Discharge: 2016-09-14 | Disposition: A | Discharge status: Home / Self Care | Payer: Medicaid Other | Attending: Emergency Medicine | Admitting: Emergency Medicine

## 2016-09-14 DIAGNOSIS — Z79899 Other long term (current) drug therapy: Secondary | ICD-10-CM | POA: Diagnosis not present

## 2016-09-14 DIAGNOSIS — H9201 Otalgia, right ear: Secondary | ICD-10-CM

## 2016-09-14 DIAGNOSIS — H608X1 Other otitis externa, right ear: Secondary | ICD-10-CM | POA: Insufficient documentation

## 2016-09-14 DIAGNOSIS — H6091 Unspecified otitis externa, right ear: Secondary | ICD-10-CM

## 2016-09-14 MED ORDER — AMOXICILLIN-POT CLAVULANATE 875-125 MG PO TABS: 1.0000 | ORAL_TABLET | Freq: Two times a day (BID) | ORAL | 0 refills | Status: DC

## 2016-09-14 MED ORDER — IBUPROFEN 800 MG PO TABS: 800.0000 mg | ORAL_TABLET | Freq: Three times a day (TID) | ORAL | 0 refills | Status: DC | PRN

## 2016-09-14 MED ORDER — CIPROFLOXACIN-DEXAMETHASONE 0.3-0.1 % OT SUSP: 4.0000 [drp] | Freq: Two times a day (BID) | OTIC | 0 refills | Status: AC
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
## 2016-09-14 NOTE — ED Triage Notes (Signed)
Pt complaint of right ear pain for 2 weeks; unable to get appointment with PCP until 11/28.

## 2016-09-14 NOTE — Discharge Instructions (Signed)
Return here as needed. Follow up with your ENT doctor.  °

## 2017-01-11 ENCOUNTER — Emergency Department (HOSPITAL_COMMUNITY)
Admission: EM | Admit: 2017-01-11 | Discharge: 2017-01-11 | Disposition: A | Discharge status: Home / Self Care | Payer: Medicaid Other | Attending: Emergency Medicine | Admitting: Emergency Medicine

## 2017-01-11 ENCOUNTER — Emergency Department (HOSPITAL_COMMUNITY): Payer: Medicaid Other

## 2017-01-11 ENCOUNTER — Encounter (HOSPITAL_COMMUNITY): Payer: Self-pay | Admitting: Emergency Medicine

## 2017-01-11 DIAGNOSIS — Z79899 Other long term (current) drug therapy: Secondary | ICD-10-CM | POA: Insufficient documentation

## 2017-01-11 DIAGNOSIS — R1084 Generalized abdominal pain: Secondary | ICD-10-CM | POA: Insufficient documentation

## 2017-01-11 DIAGNOSIS — K529 Noninfective gastroenteritis and colitis, unspecified: Secondary | ICD-10-CM | POA: Diagnosis not present

## 2017-01-11 DIAGNOSIS — R112 Nausea with vomiting, unspecified: Secondary | ICD-10-CM | POA: Diagnosis present

## 2017-01-11 LAB — COMPREHENSIVE METABOLIC PANEL
ALT: 34 U/L (ref 14–54)
AST: 28 U/L (ref 15–41)
Albumin: 5.4 g/dL — ABNORMAL HIGH (ref 3.5–5.0)
Alkaline Phosphatase: 74 U/L (ref 38–126)
Anion gap: 10 (ref 5–15)
BUN: 7 mg/dL (ref 6–20)
CO2: 22 mmol/L (ref 22–32)
Calcium: 10 mg/dL (ref 8.9–10.3)
Chloride: 108 mmol/L (ref 101–111)
Creatinine, Ser: 0.69 mg/dL (ref 0.44–1.00)
GFR calc Af Amer: >60 mL/min (ref >60)
GFR calc non Af Amer: >60 mL/min (ref >60)
Glucose, Bld: 130 mg/dL — ABNORMAL HIGH (ref 65–99)
Potassium: 3 mmol/L — ABNORMAL LOW (ref 3.5–5.1)
Sodium: 140 mmol/L (ref 135–145)
Total Bilirubin: 1.4 mg/dL — ABNORMAL HIGH (ref 0.3–1.2)
Total Protein: 9 g/dL — ABNORMAL HIGH (ref 6.5–8.1)

## 2017-01-11 LAB — CBC
HCT: 41.6 % (ref 36.0–46.0)
Hemoglobin: 14.1 g/dL (ref 12.0–15.0)
MCH: 27.8 pg (ref 26.0–34.0)
MCHC: 33.9 g/dL (ref 30.0–36.0)
MCV: 82.1 fL (ref 78.0–100.0)
Platelets: 240 10*3/uL (ref 150–400)
RBC: 5.07 MIL/uL (ref 3.87–5.11)
RDW: 15 % (ref 11.5–15.5)
WBC: 11.1 10*3/uL — ABNORMAL HIGH (ref 4.0–10.5)

## 2017-01-11 LAB — URINALYSIS, ROUTINE W REFLEX MICROSCOPIC
Bacteria, UA: NONE SEEN
Bilirubin Urine: NEGATIVE
Glucose, UA: NEGATIVE mg/dL
Ketones, ur: 20 mg/dL — AB
Leukocytes, UA: NEGATIVE
Nitrite: NEGATIVE
Protein, ur: 30 mg/dL — AB
Specific Gravity, Urine: 1.024 (ref 1.005–1.030)
pH: 6 (ref 5.0–8.0)

## 2017-01-11 LAB — LIPASE, BLOOD: Lipase: 17 U/L (ref 11–51)

## 2017-01-11 LAB — I-STAT BETA HCG BLOOD, ED (MC, WL, AP ONLY): I-stat hCG, quantitative: <5 m[IU]/mL (ref <5)

## 2017-01-11 MED ORDER — IOPAMIDOL (ISOVUE-300) INJECTION 61%
INTRAVENOUS | Status: AC
  Administered 2017-01-11: 30 mL via ORAL
  Filled 2017-01-11: qty 30

## 2017-01-11 MED ORDER — SODIUM CHLORIDE 0.9 % IV BOLUS (SEPSIS)
1000.0000 mL | Freq: Once | INTRAVENOUS | Status: AC
  Administered 2017-01-11: 1000 mL via INTRAVENOUS

## 2017-01-11 MED ORDER — MORPHINE SULFATE (PF) 4 MG/ML IV SOLN
4.0000 mg | Freq: Once | INTRAVENOUS | Status: DC
  Filled 2017-01-11: qty 1

## 2017-01-11 MED ORDER — IOPAMIDOL (ISOVUE-300) INJECTION 61%
100.0000 mL | Freq: Once | INTRAVENOUS | Status: AC | PRN
  Administered 2017-01-11: 100 mL via INTRAVENOUS

## 2017-01-11 MED ORDER — IOPAMIDOL (ISOVUE-300) INJECTION 61%
30.0000 mL | Freq: Once | INTRAVENOUS | Status: AC | PRN
  Administered 2017-01-11: 30 mL via ORAL

## 2017-01-11 MED ORDER — IOPAMIDOL (ISOVUE-300) INJECTION 61%
INTRAVENOUS | Status: AC
  Filled 2017-01-11: qty 100

## 2017-01-11 MED ORDER — PROMETHAZINE HCL 25 MG PO TABS: 25.0000 mg | ORAL_TABLET | Freq: Four times a day (QID) | ORAL | 0 refills | Status: DC | PRN

## 2017-01-11 MED ORDER — ONDANSETRON 4 MG PO TBDP
4.0000 mg | ORAL_TABLET | Freq: Once | ORAL | Status: AC | PRN
  Administered 2017-01-11: 4 mg via ORAL
  Filled 2017-01-11: qty 1

## 2017-01-11 MED ORDER — PROMETHAZINE HCL 25 MG/ML IJ SOLN
25.0000 mg | Freq: Once | INTRAMUSCULAR | Status: AC
  Administered 2017-01-11: 25 mg via INTRAVENOUS
  Filled 2017-01-11: qty 1
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
## 2017-01-11 NOTE — ED Triage Notes (Signed)
Patient c/o abd pain and n/v that started this morning. Patient states that she has had a little diarrhea. Patient denies any urinary problems.

## 2017-01-11 NOTE — Discharge Instructions (Signed)
Return here as needed.  Follow-up with her primary care doctor.  Slowly increase your fluid intake, rest as much as possible

## 2017-01-11 NOTE — ED Notes (Signed)
Medication delayed d/t not having IV access. Waiting on IV team.

## 2017-01-11 NOTE — ED Notes (Signed)
RN attempted IV 2x unsuccessfully. Will ask another RN to attempt.

## 2017-03-11 ENCOUNTER — Ambulatory Visit: Payer: Medicaid Other

## 2017-03-14 ENCOUNTER — Ambulatory Visit: Payer: Self-pay

## 2017-07-02 ENCOUNTER — Telehealth: Payer: Self-pay

## 2017-07-02 NOTE — Telephone Encounter (Signed)
Pt calling for depo refill, what pharm, has appt Fri.  [phone]

## 2017-07-04 ENCOUNTER — Other Ambulatory Visit: Payer: Self-pay | Admitting: Advanced Practice Midwife

## 2017-07-04 DIAGNOSIS — Z3042 Encounter for surveillance of injectable contraceptive: Secondary | ICD-10-CM

## 2017-07-04 MED ORDER — MEDROXYPROGESTERONE ACETATE 150 MG/ML IM SUSP: 150.0000 mg | INTRAMUSCULAR | 0 refills | Status: DC

## 2017-07-04 NOTE — Telephone Encounter (Signed)
Spoke with patient. She says she is currently having a period. Rx sent and she will bring depo with her to her appointment on Friday. Will send remainder of refills at that time

## 2017-07-05 ENCOUNTER — Ambulatory Visit (INDEPENDENT_AMBULATORY_CARE_PROVIDER_SITE_OTHER): Payer: Medicaid Other | Admitting: Advanced Practice Midwife

## 2017-07-05 ENCOUNTER — Encounter: Payer: Self-pay | Admitting: Advanced Practice Midwife

## 2017-07-05 VITALS — BP 90/58 | HR 60 | Ht 64.0 in | Wt 114.0 lb

## 2017-07-05 DIAGNOSIS — Z308 Encounter for other contraceptive management: Secondary | ICD-10-CM

## 2017-07-05 DIAGNOSIS — Z01419 Encounter for gynecological examination (general) (routine) without abnormal findings: Secondary | ICD-10-CM

## 2017-07-05 DIAGNOSIS — Z0000 Encounter for general adult medical examination without abnormal findings: Secondary | ICD-10-CM | POA: Diagnosis not present

## 2017-07-05 DIAGNOSIS — Z3042 Encounter for surveillance of injectable contraceptive: Secondary | ICD-10-CM

## 2017-07-05 MED ORDER — MEDROXYPROGESTERONE ACETATE 150 MG/ML IM SUSP: 150.0000 mg | INTRAMUSCULAR | 2 refills | Status: DC

## 2017-07-05 MED ORDER — MEDROXYPROGESTERONE ACETATE 150 MG/ML IM SUSP
150.0000 mg | Freq: Once | INTRAMUSCULAR | Status: AC
  Administered 2017-07-05: 150 mg via INTRAMUSCULAR

## 2017-07-05 NOTE — Progress Notes (Signed)
Patient ID: Joen Lauraaylor N Vannostrand, female   DOB: 07/05/1996, 21 y.o.   MRN: [id number]     Gynecology Annual Exam  PCP: Ranell PatrickMitra, Kunal, MD  Chief Complaint:  Chief Complaint  Patient presents with  . Gynecologic Exam  . Injections    depo    History of Present Illness: Patient is a 21 y.o. G1P1001 presents for annual exam. The patient has complaints today of weight loss in the past year. She has not had a good appetite. She admits to relationship stressors and is encouraged to rely her support system as needed and to eat nutrient dense foods for both nutrition and weight gain.    LMP: Patient's last menstrual period was 07/01/2017. Menarche:not applicable Average Interval: regular, 90 days Duration of flow: 7 days Heavy Menses: no Clots: no Intermenstrual Bleeding: no Postcoital Bleeding: no Dysmenorrhea: no  The patient is sexually active. She currently uses Depo-Provera injections for contraception. She denies dyspareunia.  The patient does perform self breast exams.  There is no notable family history of breast or ovarian cancer in her family.  The patient wears seatbelts: yes.  The patient has regular exercise: she is active with child care but has no other exercise.    The patient denies current symptoms of depression.    Review of Systems: Review of Systems  Constitutional: Negative.   HENT: Negative.   Eyes: Negative.   Respiratory: Negative.   Cardiovascular: Negative.   Gastrointestinal: Negative.   Genitourinary: Negative.   Musculoskeletal: Negative.   Skin: Negative.   Neurological: Negative.   Endo/Heme/Allergies: Negative.   Psychiatric/Behavioral: Negative.     Past Medical History:  Past Medical History:  Diagnosis Date  . Anemia   . Marijuana use     Past Surgical History:  Past Surgical History:  Procedure Laterality Date  . APPENDECTOMY      Gynecologic History:  Patient's last menstrual period was 07/01/2017. Contraception: Depo-Provera  injections Last Pap: Has never had a PAP smear  Obstetric History: G1P1001  Family History:  Family History  Problem Relation Age of Onset  . Hypertension Mother     Social History:  Social History   Social History  . Marital status: Single    Spouse name: N/A  . Number of children: N/A  . Years of education: N/A   Occupational History  . Not on file.   Social History Main Topics  . Smoking status: Never Smoker  . Smokeless tobacco: Never Used  . Alcohol use No  . Drug use: Yes    Types: Marijuana  . Sexual activity: Yes    Birth control/ protection: Injection   Other Topics Concern  . Not on file   Social History Narrative  . No narrative on file    Allergies:  No Known Allergies  Medications: Prior to Admission medications   Medication Sig Start Date End Date Taking? Authorizing Provider  medroxyPROGESTERone (DEPO-PROVERA) 150 MG/ML injection Inject 1 mL (150 mg total) into the muscle every 3 (three) months. 10/04/17  Yes Tresea MallGledhill, Jona Erkkila, CNM    Physical Exam Vitals: Blood pressure (!) 90/58, pulse 60, height 5\' 4"  (1.626 m), weight 114 lb (51.7 kg), last menstrual period 07/01/2017, unknown if currently breastfeeding.  General: NAD HEENT: normocephalic, anicteric Thyroid: no enlargement, no palpable nodules Pulmonary: No increased work of breathing, CTAB Cardiovascular: RRR, distal pulses 2+ Breast: Breast symmetrical, no tenderness, no palpable nodules or masses, no skin or nipple retraction present, no nipple discharge.  No axillary or supraclavicular  lymphadenopathy. Abdomen: NABS, soft, non-tender, non-distended.  Umbilicus without lesions.  No hepatomegaly, splenomegaly or masses palpable. No evidence of hernia  Genitourinary: deferred for no PAP smear, no gyn concerns and declined STD testing Extremities: no edema, erythema, or tenderness Neurologic: Grossly intact Psychiatric: mood appropriate, affect full   Assessment: 21 y.o. G1P1001 routine  annual exam  Plan: Problem List Items Addressed This Visit    None    Visit Diagnoses    Well woman exam (no gynecological exam)    -  Primary   Encounter for other contraceptive management       Relevant Medications   medroxyPROGESTERone (DEPO-PROVERA) injection 150 mg (Completed)   Encounter for surveillance of injectable contraceptive       Relevant Medications   medroxyPROGESTERone (DEPO-PROVERA) 150 MG/ML injection (Start on 10/04/2017)      1) 4) Gardasil Series discussed and if applicable offered to patient - Patient has previously completed 3 shot series   2) STI screening was offered and declined  3) ASCCP guidelines and rational discussed.  Patient opts for beginning at age 26 screening interval  4) Contraception - Patient prefers to continue with Depo  5) Increase healthy lifestyle diet and exercise, nutrient dense foods, increase h2o intake  6) Follow up 1 year for routine annual exam  Tresea Mall, CNM

## 2017-09-25 ENCOUNTER — Ambulatory Visit (INDEPENDENT_AMBULATORY_CARE_PROVIDER_SITE_OTHER): Payer: Medicaid Other

## 2017-09-25 DIAGNOSIS — Z3042 Encounter for surveillance of injectable contraceptive: Secondary | ICD-10-CM

## 2017-09-25 MED ORDER — MEDROXYPROGESTERONE ACETATE 150 MG/ML IM SUSP
150.0000 mg | Freq: Once | INTRAMUSCULAR | Status: AC
  Administered 2017-09-25: 150 mg via INTRAMUSCULAR

## 2017-12-18 ENCOUNTER — Ambulatory Visit: Payer: Medicaid Other

## 2018-07-09 ENCOUNTER — Ambulatory Visit: Payer: Medicaid Other | Admitting: Advanced Practice Midwife

## 2018-07-09 ENCOUNTER — Other Ambulatory Visit: Payer: Self-pay

## 2018-07-09 ENCOUNTER — Ambulatory Visit (INDEPENDENT_AMBULATORY_CARE_PROVIDER_SITE_OTHER): Payer: Medicaid Other

## 2018-07-09 DIAGNOSIS — Z3042 Encounter for surveillance of injectable contraceptive: Secondary | ICD-10-CM

## 2018-07-09 DIAGNOSIS — Z3202 Encounter for pregnancy test, result negative: Secondary | ICD-10-CM | POA: Diagnosis not present

## 2018-07-09 LAB — POCT URINE PREGNANCY: Preg Test, Ur: NEGATIVE

## 2018-07-09 MED ORDER — MEDROXYPROGESTERONE ACETATE 150 MG/ML IM SUSP: 150.0000 mg | INTRAMUSCULAR | 2 refills | Status: DC

## 2018-07-09 MED ORDER — MEDROXYPROGESTERONE ACETATE 150 MG/ML IM SUSP
150.0000 mg | Freq: Once | INTRAMUSCULAR | Status: AC
  Administered 2018-07-09: 150 mg via INTRAMUSCULAR

## 2018-07-09 NOTE — Progress Notes (Signed)
Pt presents for Depo Provera injection today. Last injection received November 2018. Pregnancy test done per protocol as patient is not currently on menses.

## 2018-07-18 ENCOUNTER — Ambulatory Visit: Payer: Medicaid Other | Admitting: Advanced Practice Midwife

## 2018-10-01 ENCOUNTER — Ambulatory Visit: Payer: Medicaid Other

## 2018-10-21 ENCOUNTER — Ambulatory Visit (INDEPENDENT_AMBULATORY_CARE_PROVIDER_SITE_OTHER): Payer: Medicaid Other

## 2018-10-21 DIAGNOSIS — N912 Amenorrhea, unspecified: Secondary | ICD-10-CM

## 2018-10-21 DIAGNOSIS — Z3202 Encounter for pregnancy test, result negative: Secondary | ICD-10-CM | POA: Diagnosis not present

## 2018-10-21 DIAGNOSIS — Z3042 Encounter for surveillance of injectable contraceptive: Secondary | ICD-10-CM

## 2018-10-21 LAB — POCT URINE PREGNANCY: Preg Test, Ur: NEGATIVE

## 2018-10-21 MED ORDER — MEDROXYPROGESTERONE ACETATE 150 MG/ML IM SUSP
150.0000 mg | Freq: Once | INTRAMUSCULAR | Status: AC
  Administered 2018-10-21: 150 mg via INTRAMUSCULAR

## 2018-10-21 NOTE — Progress Notes (Signed)
Pt presents late (out of date range) for Depo Provera. Pregnancy test performed per protocol. Depo Provera given IM LUOQ

## 2018-11-05 ENCOUNTER — Ambulatory Visit: Payer: Medicaid Other | Admitting: Advanced Practice Midwife

## 2018-12-07 IMAGING — CT CT ABD-PELV W/ CM
2 of 4 series · 16 of 46 positions shown, 18 images · IV contrast (ISOVUE)
Comparison: None.

CLINICAL DATA: Abdominal pain with nausea and vomiting

EXAM:
CT ABDOMEN AND PELVIS WITH CONTRAST
TECHNIQUE: Multidetector CT imaging of the abdomen and pelvis was performed
using the standard protocol following bolus administration of
intravenous contrast.
CONTRAST:  100mL VQEDRQ-E00 IOPAMIDOL (VQEDRQ-E00) INJECTION 61%

[Series 2: abd/pel with · axial · 0.67mm/px · z∈[-510,-120]mm · 13 of 88 slices shown, 15 images]
[im 5/88  soft-tissue]
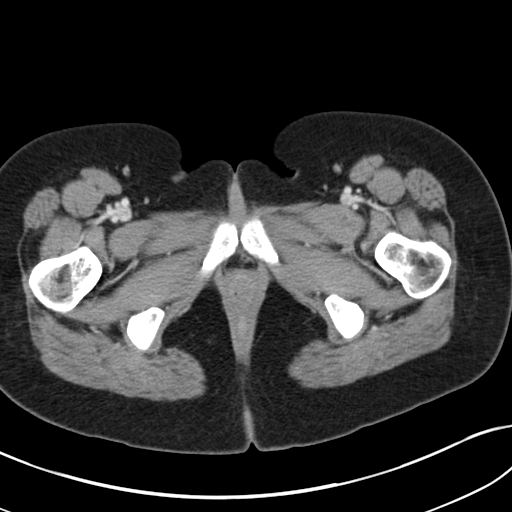
[im 5/88  bone]
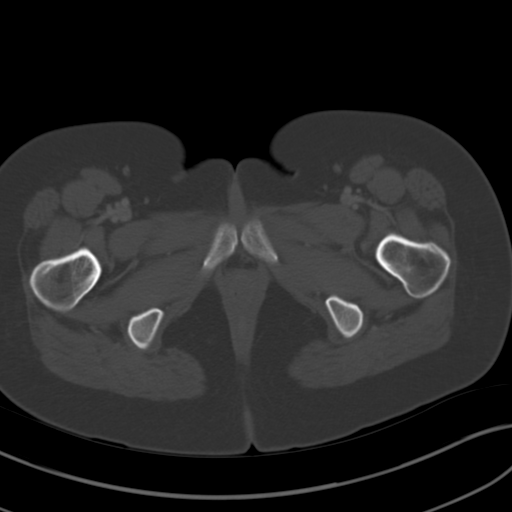
[im 10/88  soft-tissue]
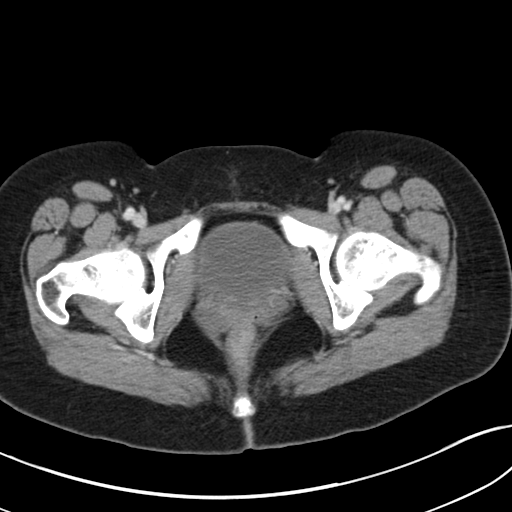
[im 20/88  soft-tissue]
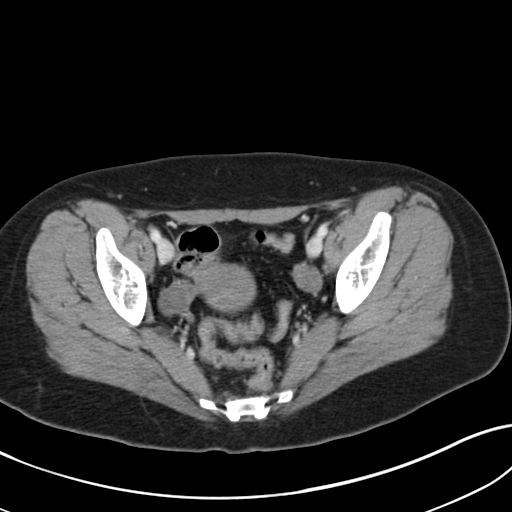
[im 25/88  soft-tissue]
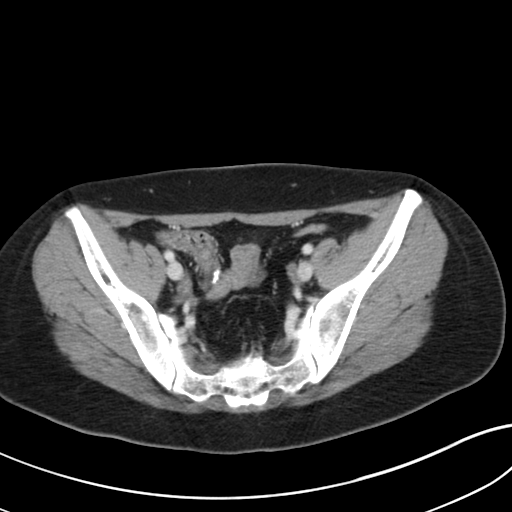
[im 30/88  soft-tissue]
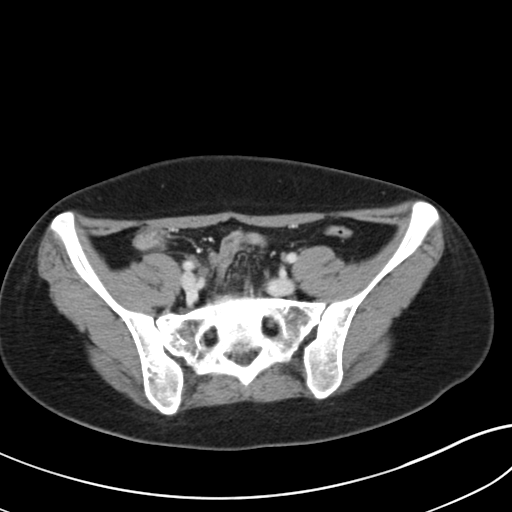
[im 39/88  soft-tissue]
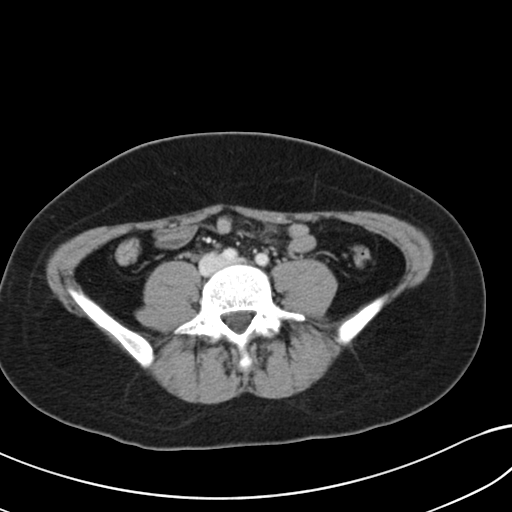
[im 44/88  soft-tissue]
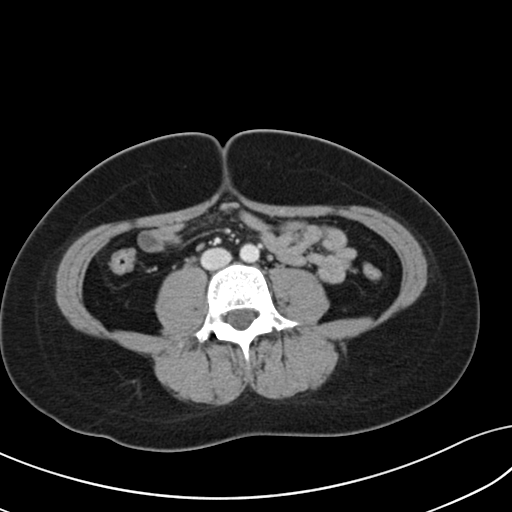
[im 49/88  soft-tissue]
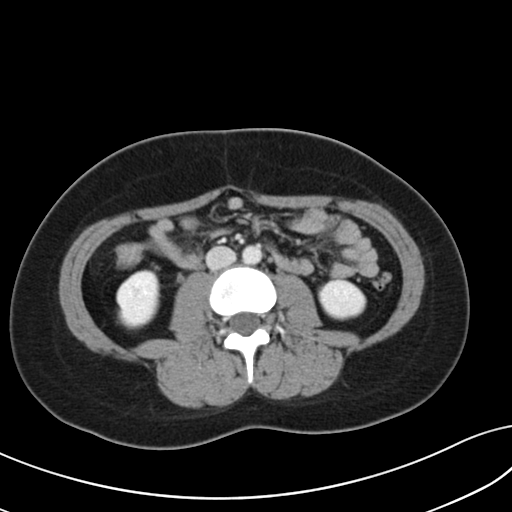
[im 59/88  soft-tissue]
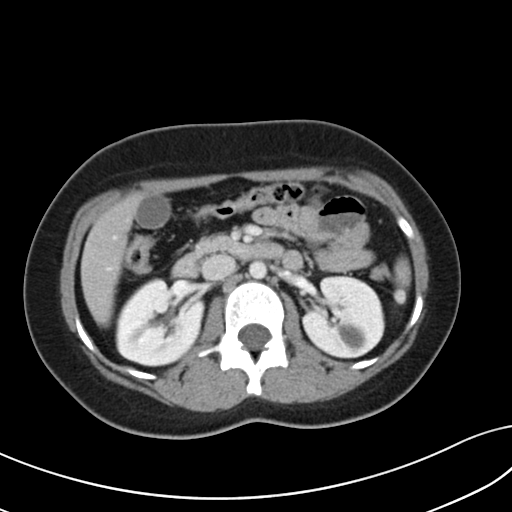
[im 59/88  bone]
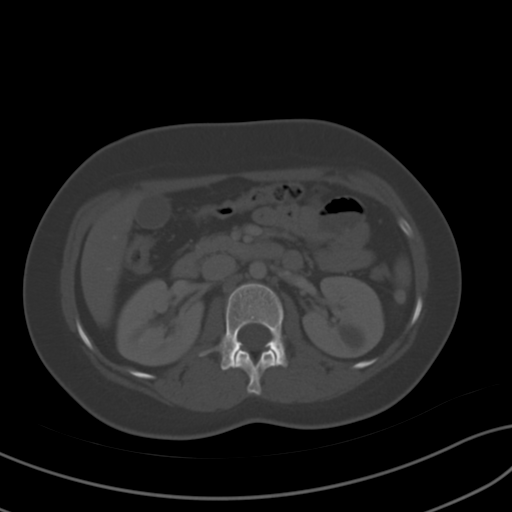
[im 63/88  soft-tissue]
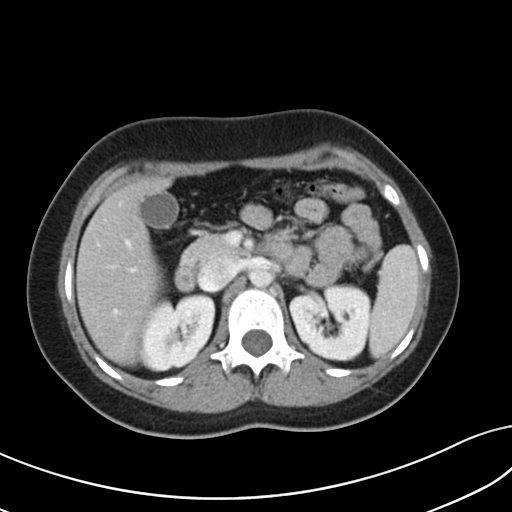
[im 68/88  soft-tissue]
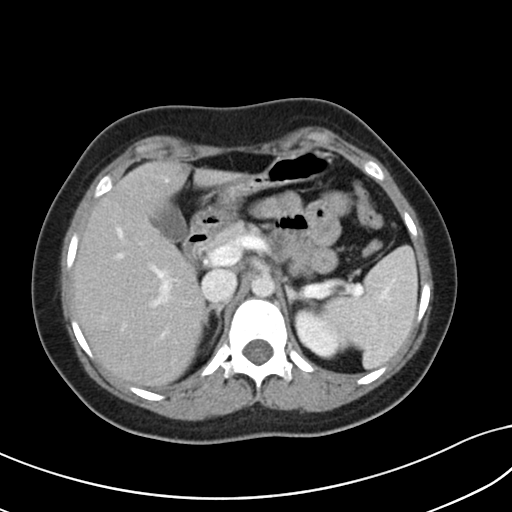
[im 78/88  soft-tissue]
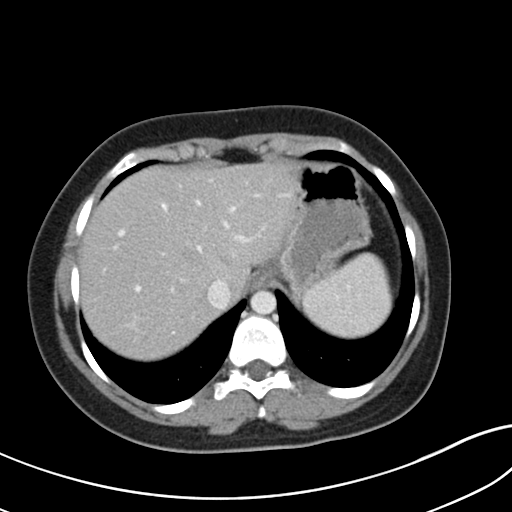
[im 83/88  soft-tissue]
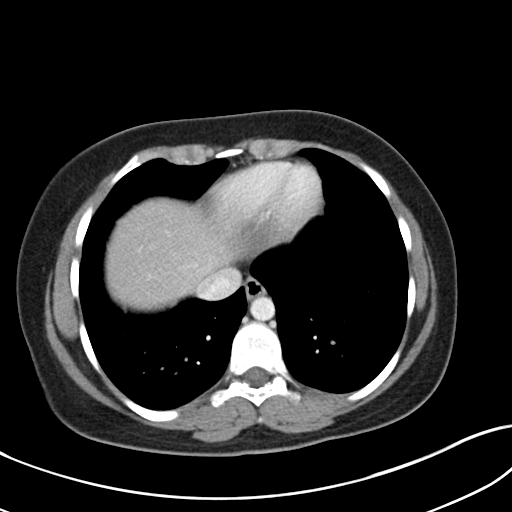

[Series 5: coronal a/|p · coronal · 0.71mm/px · 3 of 132 slices shown]
[im 44/132  soft-tissue]
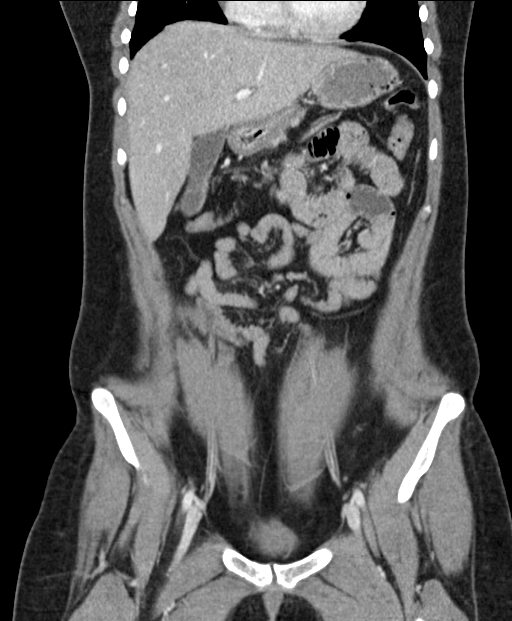
[im 59/132  soft-tissue]
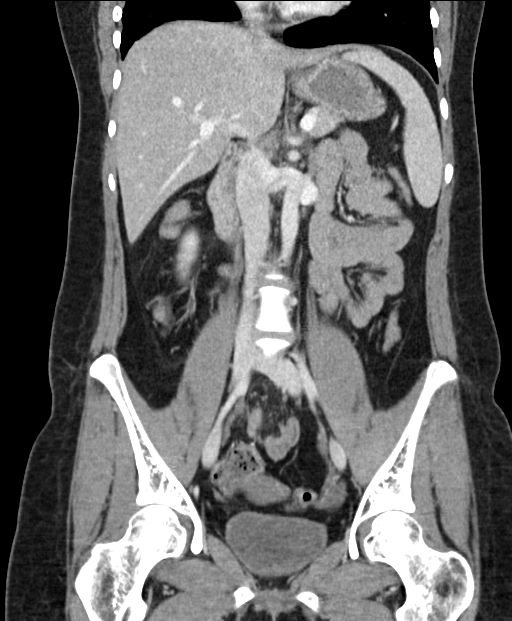
[im 73/132  soft-tissue]
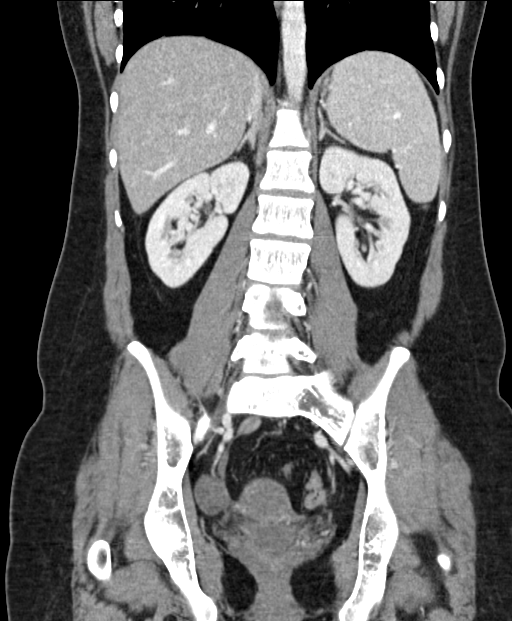

[16 of 46 positions shown; findings below may reference images not displayed]

FINDINGS: Lower chest: No acute abnormality.

Hepatobiliary: Fatty infiltration of the liver is noted. The
gallbladder is within normal limits.

Pancreas: Unremarkable. No pancreatic ductal dilatation or
surrounding inflammatory changes.

Spleen: Normal in size without focal abnormality.

Adrenals/Urinary Tract: The adrenal glands are within normal limits.
Kidneys are well visualized bilaterally and within normal limits
with the exception of a 2.3 cm left renal cyst posteriorly in the
mid kidney. No calculi are identified. No obstructive changes are
seen.

Stomach/Bowel: Changes of prior appendectomy are noted.

Vascular/Lymphatic: No significant vascular findings are present. No
enlarged abdominal or pelvic lymph nodes.

Reproductive: Uterus and bilateral adnexa are unremarkable.

Other: No abdominal wall hernia or abnormality. No abdominopelvic
ascites.

Musculoskeletal: No acute or significant osseous findings.
IMPRESSION: No acute abnormality noted.

## 2018-12-10 ENCOUNTER — Encounter: Payer: Self-pay | Admitting: Advanced Practice Midwife

## 2018-12-10 ENCOUNTER — Ambulatory Visit (INDEPENDENT_AMBULATORY_CARE_PROVIDER_SITE_OTHER): Payer: Medicaid Other | Admitting: Advanced Practice Midwife

## 2018-12-10 ENCOUNTER — Other Ambulatory Visit (HOSPITAL_COMMUNITY)
Admission: RE | Admit: 2018-12-10 | Discharge: 2018-12-10 | Disposition: A | Discharge status: Home / Self Care | Payer: Medicaid Other | Source: Ambulatory Visit | Attending: Advanced Practice Midwife | Admitting: Advanced Practice Midwife

## 2018-12-10 VITALS — BP 110/64 | Ht 63.0 in | Wt 119.0 lb

## 2018-12-10 DIAGNOSIS — Z0000 Encounter for general adult medical examination without abnormal findings: Secondary | ICD-10-CM

## 2018-12-10 DIAGNOSIS — Z113 Encounter for screening for infections with a predominantly sexual mode of transmission: Secondary | ICD-10-CM | POA: Insufficient documentation

## 2018-12-10 DIAGNOSIS — Z01419 Encounter for gynecological examination (general) (routine) without abnormal findings: Secondary | ICD-10-CM | POA: Insufficient documentation

## 2018-12-10 DIAGNOSIS — Z124 Encounter for screening for malignant neoplasm of cervix: Secondary | ICD-10-CM

## 2018-12-10 NOTE — Progress Notes (Signed)
Patient ID: Natasha Porter, female   DOB: 10/18/1996, 23 y.o.   MRN: [id number]     Gynecology Annual Exam  PCP: Patient, No Pcp Per  Chief Complaint:  Chief Complaint  Patient presents with  . Annual Exam    History of Present Illness: Patient is a 23 y.o. G1P1001 presents for annual exam. The patient has no complaints today.   LMP: approximately November 2019. On Depo-Provera injection.  Average Interval: irregular, every 3-4 months Duration of flow: 4 days Heavy Menses: no Clots: no Intermenstrual Bleeding: no Postcoital Bleeding: no Dysmenorrhea: no  The patient is sexually active. She currently uses Depo-Provera injections for contraception. She denies dyspareunia.  The patient does not perform self breast exams.  There is no notable family history of breast or ovarian cancer in her family.  The patient wears seatbelts: yes.  The patient has regular activity caring for her 2.5yo son, but limited exercise beyond that.  She walks around a track ~q2weeks.  The patient denies current symptoms of depression or anxiety.  Review of Systems: Review of Systems  Constitutional: Negative.   HENT: Negative.   Eyes: Negative.   Respiratory: Negative.   Cardiovascular: Negative.   Gastrointestinal: Negative.   Genitourinary: Negative.   Musculoskeletal: Negative.   Skin: Negative.   Neurological: Negative.   Endo/Heme/Allergies: Negative.   Psychiatric/Behavioral: Negative.   All other systems reviewed and are negative.   Past Medical History:  Past Medical History:  Diagnosis Date  . Anemia   . Marijuana use     Past Surgical History:  Past Surgical History:  Procedure Laterality Date  . APPENDECTOMY      Gynecologic History:  No LMP recorded. Patient has had an injection. Contraception: Depo-Provera injections Last Pap: Results were:  not applicable, has never had Pap   Obstetric History: G1P1001  Family History:  Family History  Problem Relation Age of  Onset  . Hypertension Mother     Social History:  Social History   Socioeconomic History  . Marital status: Single    Spouse name: Not on file  . Number of children: Not on file  . Years of education: Not on file  . Highest education level: Not on file  Occupational History  . Not on file  Social Needs  . Financial resource strain: Not on file  . Food insecurity:    Worry: Not on file    Inability: Not on file  . Transportation needs:    Medical: Not on file    Non-medical: Not on file  Tobacco Use  . Smoking status: Never Smoker  . Smokeless tobacco: Never Used  Substance and Sexual Activity  . Alcohol use: No  . Drug use: Yes    Types: Marijuana  . Sexual activity: Yes    Birth control/protection: Injection  Lifestyle  . Physical activity:    Days per week: Not on file    Minutes per session: Not on file  . Stress: Not on file  Relationships  . Social connections:    Talks on phone: Not on file    Gets together: Not on file    Attends religious service: Not on file    Active member of club or organization: Not on file    Attends meetings of clubs or organizations: Not on file    Relationship status: Not on file  . Intimate partner violence:    Fear of current or ex partner: Not on file    Emotionally abused: Not  on file    Physically abused: Not on file    Forced sexual activity: Not on file  Other Topics Concern  . Not on file  Social History Narrative  . Not on file    Allergies:  No Known Allergies  Medications: Prior to Admission medications   Medication Sig Start Date End Date Taking? Authorizing Provider  medroxyPROGESTERone (DEPO-PROVERA) 150 MG/ML injection Inject 1 mL (150 mg total) into the muscle every 3 (three) months. 07/09/18  Yes Tresea Mall, CNM    Physical Exam Vitals: Blood pressure 110/64, height 5\' 3"  (1.6 m), weight 119 lb (54 kg)  General: NAD HEENT: normocephalic, anicteric Thyroid: no enlargement, no palpable  nodules Pulmonary: No increased work of breathing, CTAB Cardiovascular: RRR, distal pulses 2+ Breast: Breast symmetrical, no tenderness, no palpable nodules or masses, no skin or nipple retraction present, no nipple discharge.  No axillary or supraclavicular lymphadenopathy. Abdomen: NABS, soft, non-tender, non-distended.  Umbilicus without lesions.  No hepatomegaly, splenomegaly or masses palpable. No evidence of hernia  Genitourinary:  External: Normal external female genitalia.  Normal urethral meatus, normal Bartholin's and Skene's glands.    Vagina: Normal vaginal mucosa, no evidence of prolapse.    Cervix: Grossly normal in appearance, no bleeding, no cmt  Uterus: deferred  Adnexa: deferred  Rectal: deferred  Lymphatic: no evidence of inguinal lymphadenopathy Extremities: no edema, erythema, or tenderness Neurologic: Grossly intact Psychiatric: mood appropriate, affect full   Assessment: 23 y.o. G1P1001 routine annual exam  Plan: Problem List Items Addressed This Visit    None    Visit Diagnoses    Well woman exam with routine gynecological exam    -  Primary   Relevant Orders   Cytology - PAP   Screen for sexually transmitted diseases       Relevant Orders   Cytology - PAP   Cervical cancer screening       Relevant Orders   Cytology - PAP      1) STI screening  was offered and accepted.  GC/CT/Trich collected with pap.  2)  ASCCP guidelines and rational discussed.  Patient opts for every 3 years screening interval.  Pap collected today.  3) Contraception - the patient is currently using  Depo-Provera injections.  She is happy with her current form of contraception and plans to continue We discussed safe sex practices to reduce her furture risk of STI's.    4) Health maintenance: increase intake of vegetables/fruit, increase water, decrease sugary drinks, increase cardiovascular exercise.  4) Return in 1 year (on 12/11/2019).    Tresea Mall, CNM [facility] 12/10/2018, 2:15 PM

## 2018-12-10 NOTE — Patient Instructions (Signed)

## 2018-12-12 LAB — CYTOLOGY - PAP
Chlamydia: NEGATIVE
Diagnosis: NEGATIVE
Neisseria Gonorrhea: NEGATIVE
Trichomonas: NEGATIVE

## 2019-01-13 ENCOUNTER — Ambulatory Visit: Payer: Medicaid Other

## 2019-01-14 ENCOUNTER — Ambulatory Visit: Payer: Medicaid Other

## 2019-06-09 ENCOUNTER — Ambulatory Visit: Payer: Medicaid Other

## 2019-06-25 ENCOUNTER — Other Ambulatory Visit: Payer: Self-pay

## 2019-06-25 ENCOUNTER — Ambulatory Visit (INDEPENDENT_AMBULATORY_CARE_PROVIDER_SITE_OTHER): Payer: Medicaid Other

## 2019-06-25 DIAGNOSIS — Z3202 Encounter for pregnancy test, result negative: Secondary | ICD-10-CM

## 2019-06-25 DIAGNOSIS — Z3042 Encounter for surveillance of injectable contraceptive: Secondary | ICD-10-CM | POA: Diagnosis not present

## 2019-06-25 DIAGNOSIS — Z7251 High risk heterosexual behavior: Secondary | ICD-10-CM

## 2019-06-25 LAB — POCT URINE PREGNANCY: Preg Test, Ur: NEGATIVE

## 2019-06-25 MED ORDER — MEDROXYPROGESTERONE ACETATE 150 MG/ML IM SUSP
150.0000 mg | Freq: Once | INTRAMUSCULAR | Status: AC
  Administered 2019-06-25: 150 mg via INTRAMUSCULAR

## 2019-06-25 NOTE — Progress Notes (Signed)
Pt given depo in RUOQ tolerated well with a negative UPT test

## 2019-07-09 DIAGNOSIS — Z20828 Contact with and (suspected) exposure to other viral communicable diseases: Secondary | ICD-10-CM | POA: Diagnosis not present

## 2019-08-18 DIAGNOSIS — B889 Infestation, unspecified: Secondary | ICD-10-CM | POA: Diagnosis not present

## 2019-08-25 DIAGNOSIS — B889 Infestation, unspecified: Secondary | ICD-10-CM | POA: Diagnosis not present

## 2019-09-17 ENCOUNTER — Ambulatory Visit: Payer: Medicaid Other

## 2019-09-23 DIAGNOSIS — B850 Pediculosis due to Pediculus humanus capitis: Secondary | ICD-10-CM | POA: Diagnosis not present

## 2020-05-01 ENCOUNTER — Other Ambulatory Visit: Payer: Self-pay

## 2020-05-01 ENCOUNTER — Emergency Department
Admission: EM | Admit: 2020-05-01 | Discharge: 2020-05-01 | Disposition: A | Discharge status: Home / Self Care | Payer: Medicaid Other | Attending: Emergency Medicine | Admitting: Emergency Medicine

## 2020-05-01 ENCOUNTER — Encounter: Payer: Self-pay | Admitting: Emergency Medicine

## 2020-05-01 DIAGNOSIS — R109 Unspecified abdominal pain: Secondary | ICD-10-CM | POA: Insufficient documentation

## 2020-05-01 DIAGNOSIS — Z79899 Other long term (current) drug therapy: Secondary | ICD-10-CM | POA: Insufficient documentation

## 2020-05-01 DIAGNOSIS — R42 Dizziness and giddiness: Secondary | ICD-10-CM | POA: Insufficient documentation

## 2020-05-01 LAB — CBC WITH DIFFERENTIAL/PLATELET
Abs Immature Granulocytes: 0.03 10*3/uL (ref 0.00–0.07)
Basophils Absolute: 0 10*3/uL (ref 0.0–0.1)
Basophils Relative: 1 %
Eosinophils Absolute: 0 10*3/uL (ref 0.0–0.5)
Eosinophils Relative: 0 %
HCT: 32.3 % — ABNORMAL LOW (ref 36.0–46.0)
Hemoglobin: 11.2 g/dL — ABNORMAL LOW (ref 12.0–15.0)
Immature Granulocytes: 1 %
Lymphocytes Relative: 13 %
Lymphs Abs: 0.7 10*3/uL (ref 0.7–4.0)
MCH: 31.2 pg (ref 26.0–34.0)
MCHC: 34.7 g/dL (ref 30.0–36.0)
MCV: 90 fL (ref 80.0–100.0)
Monocytes Absolute: 0.4 10*3/uL (ref 0.1–1.0)
Monocytes Relative: 6 %
Neutro Abs: 4.5 10*3/uL (ref 1.7–7.7)
Neutrophils Relative %: 79 %
Platelets: 150 10*3/uL (ref 150–400)
RBC: 3.59 MIL/uL — ABNORMAL LOW (ref 3.87–5.11)
RDW: 13.3 % (ref 11.5–15.5)
WBC: 5.6 10*3/uL (ref 4.0–10.5)
nRBC: 0 % (ref 0.0–0.2)

## 2020-05-01 LAB — TSH: TSH: 2.307 u[IU]/mL (ref 0.350–4.500)

## 2020-05-01 LAB — COMPREHENSIVE METABOLIC PANEL
ALT: 15 U/L (ref 0–44)
AST: 18 U/L (ref 15–41)
Albumin: 4 g/dL (ref 3.5–5.0)
Alkaline Phosphatase: 68 U/L (ref 38–126)
Anion gap: 9 (ref 5–15)
BUN: 8 mg/dL (ref 6–20)
CO2: 25 mmol/L (ref 22–32)
Calcium: 9 mg/dL (ref 8.9–10.3)
Chloride: 106 mmol/L (ref 98–111)
Creatinine, Ser: 0.57 mg/dL (ref 0.44–1.00)
GFR calc Af Amer: >60 mL/min (ref >60)
GFR calc non Af Amer: >60 mL/min (ref >60)
Glucose, Bld: 98 mg/dL (ref 70–99)
Potassium: 3.5 mmol/L (ref 3.5–5.1)
Sodium: 140 mmol/L (ref 135–145)
Total Bilirubin: 0.9 mg/dL (ref 0.3–1.2)
Total Protein: 7.5 g/dL (ref 6.5–8.1)

## 2020-05-01 LAB — LIPASE, BLOOD: Lipase: 20 U/L (ref 11–51)

## 2020-05-01 LAB — URINALYSIS, COMPLETE (UACMP) WITH MICROSCOPIC
Bilirubin Urine: NEGATIVE
Glucose, UA: NEGATIVE mg/dL
Hgb urine dipstick: NEGATIVE
Ketones, ur: NEGATIVE mg/dL
Leukocytes,Ua: NEGATIVE
Nitrite: NEGATIVE
Protein, ur: NEGATIVE mg/dL
Specific Gravity, Urine: 1.003 — ABNORMAL LOW (ref 1.005–1.030)
pH: 7 (ref 5.0–8.0)

## 2020-05-01 LAB — POCT PREGNANCY, URINE: Preg Test, Ur: NEGATIVE

## 2020-05-01 LAB — HCG, QUANTITATIVE, PREGNANCY: hCG, Beta Chain, Quant, S: <1 m[IU]/mL (ref <5)

## 2020-05-01 LAB — T4, FREE: Free T4: 0.85 ng/dL (ref 0.61–1.12)

## 2020-05-01 NOTE — ED Triage Notes (Signed)
Pt reports that her "stomach" will start to hurt and her legs will begin to hurt and she becomes lightheaded.  Pt states has happened several times in last 2 months, most recent being last night.  Pt states today she does not feel right.

## 2020-05-01 NOTE — ED Notes (Signed)
Went in rm to obtain orthostatic VS on pt but pt was in the bathroom.

## 2020-05-01 NOTE — Discharge Instructions (Addendum)
Your work-up was reassuring today.  Please return to the ER if you develop worsening symptoms or any other pain.  Otherwise please follow-up with a cardiologist.  He may need a Holter monitor if you continue to have palpitations to capture these events.
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
## 2020-05-01 NOTE — ED Notes (Signed)
Pt given two warm blankets. Mom at bedside. Call light within reach. Pt has no further needs at this time.

## 2020-05-11 DIAGNOSIS — R29898 Other symptoms and signs involving the musculoskeletal system: Secondary | ICD-10-CM | POA: Diagnosis not present

## 2020-05-11 DIAGNOSIS — R55 Syncope and collapse: Secondary | ICD-10-CM | POA: Diagnosis not present

## 2020-05-11 DIAGNOSIS — R109 Unspecified abdominal pain: Secondary | ICD-10-CM | POA: Diagnosis not present

## 2020-05-15 NOTE — Progress Notes (Signed)
Patient, No Pcp Per   Chief Complaint  Patient presents with  . Contraception    interested in OCP's    HPI:      Ms. RODNEISHA BONNET is a 24 y.o. G1P1001 whose LMP was Patient's last menstrual period was 05/02/2020 (approximate)., presents today for [facility] consult, interested in OCPs. Menses are monthly, last 5-6 days, no BTB, no dysmen. No hx of HTN, DVTs, migraines with aura. Did OCPs in past without side effects. Also did depo last yr.  She is currently sex active, not using condoms. Hx of marijuana use regularly.  Due for annual. Last annual with neg pap/STD testing 2/20  Has noticed wt loss over the past few yrs. Weighed 119 at 2/20 appt with [place]. Has had issues with near syncope and lightheadedness recently. Had normal thyroid and CMP, but anemia on 7/21 labs in ED. Not sure if she's getting enough calories. Doesn't exercise much.   Past Medical History:  Diagnosis Date  . Anemia   . Marijuana use     Past Surgical History:  Procedure Laterality Date  . APPENDECTOMY      Family History  Problem Relation Age of Onset  . Hypertension Mother     Social History   Socioeconomic History  . Marital status: Single    Spouse name: Not on file  . Number of children: Not on file  . Years of education: Not on file  . Highest education level: Not on file  Occupational History  . Not on file  Tobacco Use  . Smoking status: Never Smoker  . Smokeless tobacco: Never Used  Vaping Use  . Vaping Use: Never used  Substance and Sexual Activity  . Alcohol use: No  . Drug use: Yes    Types: Marijuana  . Sexual activity: Yes    Birth control/protection: None  Other Topics Concern  . Not on file  Social History Narrative  . Not on file   Social Determinants of Health   Financial Resource Strain:   . Difficulty of Paying Living Expenses:   Food Insecurity:   . Worried About Programme researcher, broadcasting/film/video in the Last Year:   . Barista in the Last Year:   Transportation  Needs:   . Freight forwarder (Medical):   Marland Kitchen Lack of Transportation (Non-Medical):   Physical Activity:   . Days of Exercise per Week:   . Minutes of Exercise per Session:   Stress:   . Feeling of Stress :   Social Connections:   . Frequency of Communication with Friends and Family:   . Frequency of Social Gatherings with Friends and Family:   . Attends Religious Services:   . Active Member of Clubs or Organizations:   . Attends Banker Meetings:   Marland Kitchen Marital Status:   Intimate Partner Violence:   . Fear of Current or Ex-Partner:   . Emotionally Abused:   Marland Kitchen Physically Abused:   . Sexually Abused:     Outpatient Medications Prior to Visit  Medication Sig Dispense Refill  . medroxyPROGESTERone (DEPO-PROVERA) 150 MG/ML injection Inject 1 mL (150 mg total) into the muscle every 3 (three) months. (Patient not taking: Reported on 05/16/2020) 1 mL 2   No facility-administered medications prior to visit.      ROS:  Review of Systems  Constitutional: Positive for unexpected weight change. Negative for fever.  Gastrointestinal: Negative for blood in stool, constipation, diarrhea, nausea and vomiting.  Genitourinary: Negative  for dyspareunia, dysuria, flank pain, frequency, hematuria, urgency, vaginal bleeding, vaginal discharge and vaginal pain.  Musculoskeletal: Negative for back pain.  Skin: Negative for rash.  BREAST: No symptoms   OBJECTIVE:   Vitals:  BP 100/70   Ht 5\' 4"  (1.626 m)   Wt 99 lb (44.9 kg)   LMP 05/02/2020 (Approximate)   Breastfeeding No   BMI 16.99 kg/m   Physical Exam Vitals reviewed.  Constitutional:      Appearance: She is well-developed.  Pulmonary:     Effort: Pulmonary effort is normal.  Musculoskeletal:        General: Normal range of motion.     Cervical back: Normal range of motion.  Skin:    General: Skin is warm and dry.  Neurological:     General: No focal deficit present.     Mental Status: She is alert and  oriented to person, place, and time.     Cranial Nerves: No cranial nerve deficit.  Psychiatric:        Mood and Affect: Mood normal.        Behavior: Behavior normal.        Thought Content: Thought content normal.        Judgment: Judgment normal.     Assessment/Plan: Encounter for initial prescription of contraceptive pills - Plan: Norethindrone Acetate-Ethinyl Estrad-FE (MICROGESTIN 24 FE) 1-20 MG-MCG(24) tablet; Rx OCPs. Start with next menses. Condoms. RTO in 2 months for annual and OCP f/u.   Weight loss--track calories with MyFitness Pal. If not getting enough, add more and see if wt increases. Will f/u at annual. If still losing wt, will check other labs.    Meds ordered this encounter  Medications  . Norethindrone Acetate-Ethinyl Estrad-FE (MICROGESTIN 24 FE) 1-20 MG-MCG(24) tablet    Sig: Take 1 tablet by mouth daily.    Dispense:  84 tablet    Refill:  0    Order Specific Question:   Supervising Provider    Answer:   07/03/2020 Nadara Mustard      Return in about 2 months (around 07/17/2020) for annual/wt check.  Wilene Pharo B. Shanterria Franta, PA-C 05/16/2020 5:15 PM

## 2020-05-16 ENCOUNTER — Encounter: Payer: Self-pay | Admitting: Obstetrics and Gynecology

## 2020-05-16 ENCOUNTER — Other Ambulatory Visit: Payer: Self-pay

## 2020-05-16 ENCOUNTER — Ambulatory Visit (INDEPENDENT_AMBULATORY_CARE_PROVIDER_SITE_OTHER): Payer: Medicaid Other | Admitting: Obstetrics and Gynecology

## 2020-05-16 VITALS — BP 100/70 | Ht 64.0 in | Wt 99.0 lb

## 2020-05-16 DIAGNOSIS — R634 Abnormal weight loss: Secondary | ICD-10-CM

## 2020-05-16 DIAGNOSIS — Z30011 Encounter for initial prescription of contraceptive pills: Secondary | ICD-10-CM

## 2020-05-16 MED ORDER — MICROGESTIN 24 FE 1-20 MG-MCG PO TABS: 1.0000 | ORAL_TABLET | Freq: Every day | ORAL | 0 refills | Status: DC

## 2020-05-16 NOTE — Patient Instructions (Signed)
I value your feedback and entrusting us with your care. If you get a [facility] patient survey, I would appreciate you taking the time to let us know about your experience today. Thank you!  As of October 08, 2019, your lab results will be released to your MyChart immediately, before I even have a chance to see them. Please give me time to review them and contact you if there are any abnormalities. Thank you for your patience.  

## 2020-07-17 NOTE — Progress Notes (Deleted)
PCP:  Patient, No Pcp Per   No chief complaint on file.    HPI:      Natasha Porter is a 24 y.o. G1P1001 whose LMP was No LMP recorded., presents today for her annual examination.  Her menses are {norm/abn:715}, lasting {number:22536} days.  Dysmenorrhea {dysmen:716}. She {does:18564} have intermenstrual bleeding.  Sex activity: single partner, contraception - OCP (estrogen/progesterone), started 7/21 Last Pap: 12/10/18  Results were no abnormalities Hx of STDs: {STD hx:14358}  There is no FH of breast cancer. There is no FH of ovarian cancer. The patient {does:18564} do self-breast exams.  Tobacco use: {tob:20664} Alcohol use: {Alcohol:11675} No drug use.  Exercise: {exercise:31265}  She {does:18564} get adequate calcium and Vitamin D in her diet.  Weight was 99# 7/21. Wants to gain wt. Was having menses.  The pregnancy intention screening data noted above was reviewed. Potential methods of contraception were discussed. The patient elected to proceed with {Upstream End Methods:24109}.     Past Medical History:  Diagnosis Date  . Anemia   . Marijuana use     Past Surgical History:  Procedure Laterality Date  . APPENDECTOMY      Family History  Problem Relation Age of Onset  . Hypertension Mother     Social History   Socioeconomic History  . Marital status: Single    Spouse name: Not on file  . Number of children: Not on file  . Years of education: Not on file  . Highest education level: Not on file  Occupational History  . Not on file  Tobacco Use  . Smoking status: Never Smoker  . Smokeless tobacco: Never Used  Vaping Use  . Vaping Use: Never used  Substance and Sexual Activity  . Alcohol use: No  . Drug use: Yes    Types: Marijuana  . Sexual activity: Yes    Birth control/protection: None  Other Topics Concern  . Not on file  Social History Narrative  . Not on file   Social Determinants of Health   Financial Resource Strain:   .  Difficulty of Paying Living Expenses: Not on file  Food Insecurity:   . Worried About Programme researcher, broadcasting/film/video in the Last Year: Not on file  . Ran Out of Food in the Last Year: Not on file  Transportation Needs:   . Lack of Transportation (Medical): Not on file  . Lack of Transportation (Non-Medical): Not on file  Physical Activity:   . Days of Exercise per Week: Not on file  . Minutes of Exercise per Session: Not on file  Stress:   . Feeling of Stress : Not on file  Social Connections:   . Frequency of Communication with Friends and Family: Not on file  . Frequency of Social Gatherings with Friends and Family: Not on file  . Attends Religious Services: Not on file  . Active Member of Clubs or Organizations: Not on file  . Attends Banker Meetings: Not on file  . Marital Status: Not on file  Intimate Partner Violence:   . Fear of Current or Ex-Partner: Not on file  . Emotionally Abused: Not on file  . Physically Abused: Not on file  . Sexually Abused: Not on file     Current Outpatient Medications:  .  medroxyPROGESTERone (DEPO-PROVERA) 150 MG/ML injection, Inject 1 mL (150 mg total) into the muscle every 3 (three) months. (Patient not taking: Reported on 05/16/2020), Disp: 1 mL, Rfl: 2 .  Norethindrone  Acetate-Ethinyl Estrad-FE (MICROGESTIN 24 FE) 1-20 MG-MCG(24) tablet, Take 1 tablet by mouth daily., Disp: 84 tablet, Rfl: 0     ROS:  Review of Systems BREAST: No symptoms   Objective: There were no vitals taken for this visit.   OBGyn Exam  Results: No results found for this or any previous visit (from the past 24 hour(s)).  Assessment/Plan: No diagnosis found.  No orders of the defined types were placed in this encounter.            GYN counsel {counseling:16159}     F/U  No follow-ups on file.  Grace Haggart B. Maryl Blalock, PA-C 07/17/2020 5:00 PM

## 2020-07-18 ENCOUNTER — Ambulatory Visit: Payer: Self-pay | Admitting: Obstetrics and Gynecology

## 2020-07-27 DIAGNOSIS — R55 Syncope and collapse: Secondary | ICD-10-CM | POA: Diagnosis not present

## 2020-08-03 DIAGNOSIS — R109 Unspecified abdominal pain: Secondary | ICD-10-CM | POA: Diagnosis not present

## 2020-08-03 DIAGNOSIS — R29898 Other symptoms and signs involving the musculoskeletal system: Secondary | ICD-10-CM | POA: Diagnosis not present

## 2020-08-03 DIAGNOSIS — R000 Tachycardia, unspecified: Secondary | ICD-10-CM | POA: Diagnosis not present

## 2020-08-03 DIAGNOSIS — R55 Syncope and collapse: Secondary | ICD-10-CM | POA: Diagnosis not present

## 2020-08-11 DIAGNOSIS — R55 Syncope and collapse: Secondary | ICD-10-CM | POA: Diagnosis not present

## 2020-08-18 ENCOUNTER — Ambulatory Visit: Payer: Self-pay | Admitting: Obstetrics and Gynecology

## 2020-08-18 NOTE — Progress Notes (Deleted)
PCP:  Patient, No Pcp Per   No chief complaint on file.    HPI:      [name] is a 24 y.o. G1P1001 whose LMP was No LMP recorded., presents today for her annual examination.  Her menses are {norm/abn:715}, lasting {number:22536} days.  Dysmenorrhea {dysmen:716}. She {does:18564} have intermenstrual bleeding.  Sex activity: single partner, contraception - OCP (estrogen/progesterone).  Last Pap: 12/10/18  Results were: no abnormalities  Hx of STDs: {STD hx:14358}  There is no FH of breast cancer. There is no FH of ovarian cancer. The patient {does:18564} do self-breast exams.  Tobacco use: {tob:20664} Alcohol use: {Alcohol:11675} No drug use.  Exercise: {exercise:31265}  She {does:18564} get adequate calcium and Vitamin D in her diet. Has noticed wt loss over the past few yrs. Weighed 119 at 2/20 appt with [place]. Has had issues with near syncope and lightheadedness recently. Had normal thyroid and CMP, but anemia on 7/21 labs in ED. Not sure if she's getting enough calories. Doesn't exercise much.  The pregnancy intention screening data noted above was reviewed. Potential methods of contraception were discussed. The patient elected to proceed with {Upstream End Methods:24109}.     Past Medical History:  Diagnosis Date  . Anemia   . Marijuana use     Past Surgical History:  Procedure Laterality Date  . APPENDECTOMY      Family History  Problem Relation Age of Onset  . Hypertension Mother     Social History   Socioeconomic History  . Marital status: Single    Spouse name: Not on file  . Number of children: Not on file  . Years of education: Not on file  . Highest education level: Not on file  Occupational History  . Not on file  Tobacco Use  . Smoking status: Never Smoker  . Smokeless tobacco: Never Used  Vaping Use  . Vaping Use: Never used  Substance and Sexual Activity  . Alcohol use: No  . Drug use: Yes    Types: Marijuana  . Sexual activity:  Yes    Birth control/protection: None  Other Topics Concern  . Not on file  Social History Narrative  . Not on file   Social Determinants of Health   Financial Resource Strain:   . Difficulty of Paying Living Expenses: Not on file  Food Insecurity:   . Worried About Programme researcher, broadcasting/film/video in the Last Year: Not on file  . Ran Out of Food in the Last Year: Not on file  Transportation Needs:   . Lack of Transportation (Medical): Not on file  . Lack of Transportation (Non-Medical): Not on file  Physical Activity:   . Days of Exercise per Week: Not on file  . Minutes of Exercise per Session: Not on file  Stress:   . Feeling of Stress : Not on file  Social Connections:   . Frequency of Communication with Friends and Family: Not on file  . Frequency of Social Gatherings with Friends and Family: Not on file  . Attends Religious Services: Not on file  . Active Member of Clubs or Organizations: Not on file  . Attends Banker Meetings: Not on file  . Marital Status: Not on file  Intimate Partner Violence:   . Fear of Current or Ex-Partner: Not on file  . Emotionally Abused: Not on file  . Physically Abused: Not on file  . Sexually Abused: Not on file     Current Outpatient Medications:  .  medroxyPROGESTERone (DEPO-PROVERA) 150 MG/ML injection, Inject 1 mL (150 mg total) into the muscle every 3 (three) months. (Patient not taking: Reported on 05/16/2020), Disp: 1 mL, Rfl: 2 .  Norethindrone Acetate-Ethinyl Estrad-FE (MICROGESTIN 24 FE) 1-20 MG-MCG(24) tablet, Take 1 tablet by mouth daily., Disp: 84 tablet, Rfl: 0     ROS:  Review of Systems BREAST: No symptoms   Objective: There were no vitals taken for this visit.   OBGyn Exam  Results: No results found for this or any previous visit (from the past 24 hour(s)).  Assessment/Plan: No diagnosis found.  No orders of the defined types were placed in this encounter.            GYN counsel  {counseling:16159}     F/U  No follow-ups on file.  Shalan Neault B. Adriyana Greenbaum, PA-C 08/18/2020 1:02 PM

## 2020-09-29 ENCOUNTER — Encounter: Payer: Self-pay | Admitting: Emergency Medicine

## 2020-09-29 ENCOUNTER — Other Ambulatory Visit: Payer: Self-pay

## 2020-09-29 ENCOUNTER — Ambulatory Visit
Admission: EM | Admit: 2020-09-29 | Discharge: 2020-09-29 | Disposition: A | Discharge status: Home / Self Care | Payer: Medicaid Other | Attending: Internal Medicine | Admitting: Internal Medicine

## 2020-09-29 DIAGNOSIS — H6691 Otitis media, unspecified, right ear: Secondary | ICD-10-CM | POA: Diagnosis not present

## 2020-09-29 DIAGNOSIS — H6123 Impacted cerumen, bilateral: Secondary | ICD-10-CM

## 2020-09-29 MED ORDER — AMOXICILLIN 875 MG PO TABS: 875.0000 mg | ORAL_TABLET | Freq: Two times a day (BID) | ORAL | 0 refills | Status: DC
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
## 2020-09-29 NOTE — ED Triage Notes (Signed)
Patient in today c/o right ear pain, fullness and decreased hearing x 5 days. Patient has taken OTC Ibuprofen and Tylenol without relief. Patient denies fever.

## 2020-11-17 ENCOUNTER — Ambulatory Visit: Payer: Medicaid Other | Admitting: Physician Assistant

## 2021-02-15 DIAGNOSIS — Z111 Encounter for screening for respiratory tuberculosis: Secondary | ICD-10-CM | POA: Diagnosis not present

## 2021-04-18 ENCOUNTER — Encounter: Payer: Self-pay | Admitting: Obstetrics and Gynecology

## 2021-04-18 ENCOUNTER — Ambulatory Visit (INDEPENDENT_AMBULATORY_CARE_PROVIDER_SITE_OTHER): Payer: Medicaid Other | Admitting: Obstetrics and Gynecology

## 2021-04-18 ENCOUNTER — Other Ambulatory Visit (HOSPITAL_COMMUNITY)
Admission: RE | Admit: 2021-04-18 | Discharge: 2021-04-18 | Disposition: A | Discharge status: Home / Self Care | Payer: Medicaid Other | Source: Ambulatory Visit | Attending: Obstetrics and Gynecology | Admitting: Obstetrics and Gynecology

## 2021-04-18 ENCOUNTER — Other Ambulatory Visit: Payer: Self-pay

## 2021-04-18 VITALS — BP 90/60 | Ht 63.0 in | Wt 137.0 lb

## 2021-04-18 DIAGNOSIS — Z113 Encounter for screening for infections with a predominantly sexual mode of transmission: Secondary | ICD-10-CM | POA: Diagnosis not present

## 2021-04-18 DIAGNOSIS — Z01419 Encounter for gynecological examination (general) (routine) without abnormal findings: Secondary | ICD-10-CM | POA: Diagnosis not present

## 2021-04-18 DIAGNOSIS — Z124 Encounter for screening for malignant neoplasm of cervix: Secondary | ICD-10-CM | POA: Insufficient documentation

## 2021-04-18 NOTE — Progress Notes (Signed)
PCP:  Patient, No Pcp Per (Inactive)   Chief Complaint  Patient presents with   Gynecologic Exam    No concerns     HPI:      Ms. LATIFFANY HARWICK is a 25 y.o. G1P1001 whose LMP was Patient's last menstrual period was 04/07/2021 (exact date)., presents today for her annual examination.  Her menses are regular every 28-30 days, lasting 6-7 days.  Dysmenorrhea none. She does not have intermenstrual bleeding.  Sex activity: single partner, contraception - none. Started OCPs 7/21 but didn't continue them. Declines BC. Conception ok, not taking PNVs. Last Pap: 12/10/18  Results were: no abnormalities  Hx of STDs: none  There is no FH of breast cancer. There is no FH of ovarian cancer. The patient does self-breast exams.  Tobacco use: vapes daily Alcohol use: none No drug use.  Exercise: moderately active  She does get adequate calcium but not Vitamin D in her diet.   Past Medical History:  Diagnosis Date   Anemia    Marijuana use     Past Surgical History:  Procedure Laterality Date   APPENDECTOMY      Family History  Problem Relation Age of Onset   Other Mother        unknown medical history   Mental illness Father    Hypertension Paternal Grandmother    Diabetes Paternal Grandmother     Social History   Socioeconomic History   Marital status: Single    Spouse name: Not on file   Number of children: Not on file   Years of education: Not on file   Highest education level: Not on file  Occupational History   Not on file  Tobacco Use   Smoking status: Never   Smokeless tobacco: Never  Vaping Use   Vaping Use: Every day   Substances: Nicotine, Flavoring  Substance and Sexual Activity   Alcohol use: No   Drug use: Not Currently    Types: Marijuana    Comment: last use 08/27/20   Sexual activity: Yes    Birth control/protection: None  Other Topics Concern   Not on file  Social History Narrative   Not on file   Social Determinants of Health    Financial Resource Strain: Not on file  Food Insecurity: Not on file  Transportation Needs: Not on file  Physical Activity: Not on file  Stress: Not on file  Social Connections: Not on file  Intimate Partner Violence: Not on file    No current outpatient medications on file.     ROS:  Review of Systems  Constitutional:  Negative for fatigue, fever and unexpected weight change.  Respiratory:  Negative for cough, shortness of breath and wheezing.   Cardiovascular:  Negative for chest pain, palpitations and leg swelling.  Gastrointestinal:  Negative for blood in stool, constipation, diarrhea, nausea and vomiting.  Endocrine: Negative for cold intolerance, heat intolerance and polyuria.  Genitourinary:  Negative for dyspareunia, dysuria, flank pain, frequency, genital sores, hematuria, menstrual problem, pelvic pain, urgency, vaginal bleeding, vaginal discharge and vaginal pain.  Musculoskeletal:  Negative for back pain, joint swelling and myalgias.  Skin:  Negative for rash.  Neurological:  Negative for dizziness, syncope, light-headedness, numbness and headaches.  Hematological:  Negative for adenopathy.  Psychiatric/Behavioral:  Negative for agitation, confusion, sleep disturbance and suicidal ideas. The patient is not nervous/anxious.   BREAST: No symptoms   Objective: BP 90/60   Ht 5\' 3"  (1.6 m)   Wt 137  lb (62.1 kg)   LMP 04/07/2021 (Exact Date)   BMI 24.27 kg/m    Physical Exam Constitutional:      Appearance: She is well-developed.  Genitourinary:     Vulva normal.     Right Labia: No rash, tenderness or lesions.    Left Labia: No tenderness, lesions or rash.    No vaginal discharge, erythema or tenderness.      Right Adnexa: not tender and no mass present.    Left Adnexa: not tender and no mass present.    No cervical friability or polyp.     Uterus is not enlarged or tender.  Breasts:    Right: No mass, nipple discharge, skin change or tenderness.      Left: No mass, nipple discharge, skin change or tenderness.  Neck:     Thyroid: No thyromegaly.  Cardiovascular:     Rate and Rhythm: Normal rate and regular rhythm.     Heart sounds: Normal heart sounds. No murmur heard. Pulmonary:     Effort: Pulmonary effort is normal.     Breath sounds: Normal breath sounds.  Abdominal:     Palpations: Abdomen is soft.     Tenderness: There is no abdominal tenderness. There is no guarding or rebound.  Musculoskeletal:        General: Normal range of motion.     Cervical back: Normal range of motion.  Lymphadenopathy:     Cervical: No cervical adenopathy.  Neurological:     General: No focal deficit present.     Mental Status: She is alert and oriented to person, place, and time.     Cranial Nerves: No cranial nerve deficit.  Skin:    General: Skin is warm and dry.  Psychiatric:        Mood and Affect: Mood normal.        Behavior: Behavior normal.        Thought Content: Thought content normal.        Judgment: Judgment normal.  Vitals reviewed.    Assessment/Plan: Encounter for annual routine gynecological examination  Cervical cancer screening - Plan: Cytology - PAP  Screening for STD (sexually transmitted disease) - Plan: Cytology - PAP  Pt declines BC. Start PNVs. F/u prn.             GYN counsel adequate intake of calcium and vitamin D, diet and exercise     F/U  Return in about 1 year (around 04/18/2022).  Arianna Haydon B. Crisol Muecke, PA-C 04/18/2021 2:43 PM

## 2021-04-20 LAB — CYTOLOGY - PAP
Chlamydia: NEGATIVE
Comment: NEGATIVE
Comment: NORMAL
Diagnosis: NEGATIVE
Neisseria Gonorrhea: NEGATIVE

## 2021-05-09 DIAGNOSIS — R509 Fever, unspecified: Secondary | ICD-10-CM | POA: Diagnosis not present

## 2021-05-24 DIAGNOSIS — F419 Anxiety disorder, unspecified: Secondary | ICD-10-CM | POA: Diagnosis not present

## 2021-05-24 DIAGNOSIS — Z91410 Personal history of adult physical and sexual abuse: Secondary | ICD-10-CM | POA: Insufficient documentation

## 2021-05-24 DIAGNOSIS — Z3169 Encounter for other general counseling and advice on procreation: Secondary | ICD-10-CM | POA: Diagnosis not present

## 2021-06-23 ENCOUNTER — Telehealth: Payer: Self-pay

## 2021-06-23 NOTE — Telephone Encounter (Signed)
Pt calling; is starting a new job; pt states they need a copy of her last physical; pt states she was seen 04/18/21.  [phone] Pt states again they need a copy of her last physical;  adv pt we need her to come by and fill out a ROI - will need fax number for [place] to fax it to.

## 2021-09-27 ENCOUNTER — Other Ambulatory Visit: Payer: Self-pay

## 2021-09-27 ENCOUNTER — Ambulatory Visit (INDEPENDENT_AMBULATORY_CARE_PROVIDER_SITE_OTHER): Payer: Medicaid Other | Admitting: Obstetrics and Gynecology

## 2021-09-27 ENCOUNTER — Encounter: Payer: Self-pay | Admitting: Obstetrics and Gynecology

## 2021-09-27 VITALS — BP 90/60 | Ht 64.0 in | Wt 149.0 lb

## 2021-09-27 DIAGNOSIS — R399 Unspecified symptoms and signs involving the genitourinary system: Secondary | ICD-10-CM

## 2021-09-27 LAB — POCT URINALYSIS DIPSTICK
Bilirubin, UA: NEGATIVE
Glucose, UA: NEGATIVE
Ketones, UA: NEGATIVE
Nitrite, UA: NEGATIVE
Protein, UA: NEGATIVE
Spec Grav, UA: 1.02 (ref 1.010–1.025)
pH, UA: 7 (ref 5.0–8.0)

## 2021-09-27 MED ORDER — NITROFURANTOIN MONOHYD MACRO 100 MG PO CAPS: 100.0000 mg | ORAL_CAPSULE | Freq: Two times a day (BID) | ORAL | 0 refills | Status: AC
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
## 2021-09-30 LAB — URINE CULTURE

## 2021-11-13 DIAGNOSIS — K000 Anodontia: Secondary | ICD-10-CM | POA: Diagnosis not present

## 2022-02-15 ENCOUNTER — Ambulatory Visit
Admission: EM | Admit: 2022-02-15 | Discharge: 2022-02-15 | Disposition: A | Discharge status: Home / Self Care | Payer: Medicaid Other | Attending: Physician Assistant | Admitting: Physician Assistant

## 2022-02-15 DIAGNOSIS — R509 Fever, unspecified: Secondary | ICD-10-CM | POA: Diagnosis not present

## 2022-02-15 DIAGNOSIS — J020 Streptococcal pharyngitis: Secondary | ICD-10-CM | POA: Diagnosis not present

## 2022-02-15 LAB — GROUP A STREP BY PCR: Group A Strep by PCR: DETECTED — AB

## 2022-02-15 MED ORDER — AMOXICILLIN 500 MG PO CAPS: 500.0000 mg | ORAL_CAPSULE | Freq: Two times a day (BID) | ORAL | 0 refills | Status: AC
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
## 2022-02-15 NOTE — ED Triage Notes (Signed)
Pt c/o sore throat, body chills, body aches, temperature of 101.5 x2days.  ?

## 2022-02-15 NOTE — Discharge Instructions (Addendum)
-  Strep test is positive.  I have sent antibiotics to pharmacy.  Continue ibuprofen and Tylenol for fever and discomfort.  Should be breaking through the next couple days.  Increase rest and fluids. ?

## 2022-04-02 ENCOUNTER — Ambulatory Visit
Admission: EM | Admit: 2022-04-02 | Discharge: 2022-04-02 | Disposition: A | Discharge status: Home / Self Care | Payer: Medicaid Other | Attending: Physician Assistant | Admitting: Physician Assistant

## 2022-04-02 DIAGNOSIS — Z111 Encounter for screening for respiratory tuberculosis: Secondary | ICD-10-CM | POA: Diagnosis not present

## 2022-04-02 MED ORDER — TUBERCULIN PPD 5 UNIT/0.1ML ID SOLN
5.0000 [IU] | Freq: Once | INTRADERMAL | Status: DC
Start: 2022-04-02 — End: 2022-04-02
  Administered 2022-04-02: 5 [IU] via INTRADERMAL

## 2022-04-02 NOTE — ED Triage Notes (Signed)
Pt is here for a PPD placement for work. Pt received the injection on the Left Forearm. Pt voiced no concerns or complaints at time of injection. Pt states that she will return after 48hours on Wednesday after 6:30pm.

## 2022-04-12 ENCOUNTER — Ambulatory Visit
Admission: RE | Admit: 2022-04-12 | Discharge: 2022-04-12 | Disposition: A | Discharge status: Home / Self Care | Payer: Medicaid Other | Source: Ambulatory Visit | Attending: Internal Medicine | Admitting: Internal Medicine

## 2022-04-12 VITALS — BP 106/80 | HR 88 | Temp 98.4°F | Resp 17

## 2022-04-12 DIAGNOSIS — N3000 Acute cystitis without hematuria: Secondary | ICD-10-CM

## 2022-04-12 LAB — URINALYSIS, ROUTINE W REFLEX MICROSCOPIC
Bilirubin Urine: NEGATIVE
Glucose, UA: NEGATIVE mg/dL
Ketones, ur: NEGATIVE mg/dL
Nitrite: NEGATIVE
Protein, ur: 30 mg/dL — AB
Specific Gravity, Urine: 1.025 (ref 1.005–1.030)
pH: 6.5 (ref 5.0–8.0)

## 2022-04-12 LAB — URINALYSIS, MICROSCOPIC (REFLEX): WBC, UA: >50 WBC/hpf (ref 0–5)

## 2022-04-12 MED ORDER — NITROFURANTOIN MONOHYD MACRO 100 MG PO CAPS: 100.0000 mg | ORAL_CAPSULE | Freq: Two times a day (BID) | ORAL | 0 refills | Status: DC

## 2022-04-12 NOTE — Discharge Instructions (Addendum)
We are sending your urine to have a culture done and if we need to change your medication, we will call you

## 2022-04-12 NOTE — ED Triage Notes (Signed)
Pt reports urinary frequency and burning sensation after urinating. States today makes day 3 of symptoms. Denies taking any OTC medication for relief.
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
## 2022-04-15 LAB — URINE CULTURE: Culture: >=100000 — AB

## 2022-05-01 NOTE — Progress Notes (Deleted)
PCP:  Pcp, No   No chief complaint on file.    HPI:      [name] is a 26 y.o. G1P1001 whose LMP was Patient's last menstrual period was 04/02/2022., presents today for her annual examination.  Her menses are regular every 28-30 days, lasting 6-7 days.  Dysmenorrhea none. She does not have intermenstrual bleeding.  Sex activity: single partner, contraception - none. Started OCPs 7/21 but didn't continue them. Declines BC. Conception ok, not taking PNVs. Last Pap: 04/18/21  Results were: no abnormalities  Hx of STDs: none  There is no FH of breast cancer. There is no FH of ovarian cancer. The patient does self-breast exams.  Tobacco use: vapes daily Alcohol use: none No drug use.  Exercise: moderately active  She does get adequate calcium but not Vitamin D in her diet.   Past Medical History:  Diagnosis Date   Anemia    Marijuana use     Past Surgical History:  Procedure Laterality Date   APPENDECTOMY      Family History  Problem Relation Age of Onset   Other Mother        unknown medical history   Mental illness Father    Hypertension Paternal Grandmother    Diabetes Paternal Grandmother     Social History   Socioeconomic History   Marital status: Single    Spouse name: Not on file   Number of children: Not on file   Years of education: Not on file   Highest education level: Not on file  Occupational History   Not on file  Tobacco Use   Smoking status: Never   Smokeless tobacco: Never  Vaping Use   Vaping Use: Former   Substances: Nicotine, Flavoring  Substance and Sexual Activity   Alcohol use: No   Drug use: Not Currently    Types: Marijuana    Comment: last use 08/27/20   Sexual activity: Yes    Birth control/protection: None  Other Topics Concern   Not on file  Social History Narrative   Not on file   Social Determinants of Health   Financial Resource Strain: Not on file  Food Insecurity: Not on file  Transportation Needs:  Not on file  Physical Activity: Not on file  Stress: Not on file  Social Connections: Not on file  Intimate Partner Violence: Not on file     Current Outpatient Medications:    nitrofurantoin, macrocrystal-monohydrate, (MACROBID) 100 MG capsule, Take 1 capsule (100 mg total) by mouth 2 (two) times daily., Disp: 10 capsule, Rfl: 0     ROS:  Review of Systems  Constitutional:  Negative for fatigue, fever and unexpected weight change.  Respiratory:  Negative for cough, shortness of breath and wheezing.   Cardiovascular:  Negative for chest pain, palpitations and leg swelling.  Gastrointestinal:  Negative for blood in stool, constipation, diarrhea, nausea and vomiting.  Endocrine: Negative for cold intolerance, heat intolerance and polyuria.  Genitourinary:  Negative for dyspareunia, dysuria, flank pain, frequency, genital sores, hematuria, menstrual problem, pelvic pain, urgency, vaginal bleeding, vaginal discharge and vaginal pain.  Musculoskeletal:  Negative for back pain, joint swelling and myalgias.  Skin:  Negative for rash.  Neurological:  Negative for dizziness, syncope, light-headedness, numbness and headaches.  Hematological:  Negative for adenopathy.  Psychiatric/Behavioral:  Negative for agitation, confusion, sleep disturbance and suicidal ideas. The patient is not nervous/anxious.    BREAST: No symptoms   Objective: LMP 04/02/2022  Physical Exam Constitutional:      Appearance: She is well-developed.  Genitourinary:     Vulva normal.     Right Labia: No rash, tenderness or lesions.    Left Labia: No tenderness, lesions or rash.    No vaginal discharge, erythema or tenderness.      Right Adnexa: not tender and no mass present.    Left Adnexa: not tender and no mass present.    No cervical friability or polyp.     Uterus is not enlarged or tender.  Breasts:    Right: No mass, nipple discharge, skin change or tenderness.     Left: No mass, nipple discharge,  skin change or tenderness.  Neck:     Thyroid: No thyromegaly.  Cardiovascular:     Rate and Rhythm: Normal rate and regular rhythm.     Heart sounds: Normal heart sounds. No murmur heard. Pulmonary:     Effort: Pulmonary effort is normal.     Breath sounds: Normal breath sounds.  Abdominal:     Palpations: Abdomen is soft.     Tenderness: There is no abdominal tenderness. There is no guarding or rebound.  Musculoskeletal:        General: Normal range of motion.     Cervical back: Normal range of motion.  Lymphadenopathy:     Cervical: No cervical adenopathy.  Neurological:     General: No focal deficit present.     Mental Status: She is alert and oriented to person, place, and time.     Cranial Nerves: No cranial nerve deficit.  Skin:    General: Skin is warm and dry.  Psychiatric:        Mood and Affect: Mood normal.        Behavior: Behavior normal.        Thought Content: Thought content normal.        Judgment: Judgment normal.  Vitals reviewed.     Assessment/Plan: Encounter for annual routine gynecological examination  Cervical cancer screening - Plan: Cytology - PAP  Screening for STD (sexually transmitted disease) - Plan: Cytology - PAP  Pt declines BC. Start PNVs. F/u prn.             GYN counsel adequate intake of calcium and vitamin D, diet and exercise     F/U  No follow-ups on file.  Loann Chahal B. Kazi Montoro, PA-C 05/01/2022 5:22 PM

## 2022-05-02 ENCOUNTER — Ambulatory Visit: Payer: Medicaid Other | Admitting: Obstetrics and Gynecology

## 2022-05-02 DIAGNOSIS — Z01419 Encounter for gynecological examination (general) (routine) without abnormal findings: Secondary | ICD-10-CM

## 2022-05-02 DIAGNOSIS — Z113 Encounter for screening for infections with a predominantly sexual mode of transmission: Secondary | ICD-10-CM

## 2022-10-07 DIAGNOSIS — M545 Low back pain, unspecified: Secondary | ICD-10-CM | POA: Diagnosis not present

## 2022-10-07 DIAGNOSIS — R079 Chest pain, unspecified: Secondary | ICD-10-CM | POA: Diagnosis not present

## 2022-10-07 DIAGNOSIS — K2970 Gastritis, unspecified, without bleeding: Secondary | ICD-10-CM | POA: Diagnosis not present

## 2022-10-07 DIAGNOSIS — R0789 Other chest pain: Secondary | ICD-10-CM | POA: Diagnosis not present

## 2022-10-08 DIAGNOSIS — K2970 Gastritis, unspecified, without bleeding: Secondary | ICD-10-CM | POA: Diagnosis not present

## 2022-10-08 DIAGNOSIS — R079 Chest pain, unspecified: Secondary | ICD-10-CM | POA: Diagnosis not present

## 2022-10-16 ENCOUNTER — Ambulatory Visit
Admission: EM | Admit: 2022-10-16 | Discharge: 2022-10-16 | Disposition: A | Discharge status: Home / Self Care | Payer: Medicaid Other | Attending: Family Medicine | Admitting: Family Medicine

## 2022-10-16 DIAGNOSIS — J069 Acute upper respiratory infection, unspecified: Secondary | ICD-10-CM | POA: Diagnosis not present

## 2022-10-16 DIAGNOSIS — Z20822 Contact with and (suspected) exposure to covid-19: Secondary | ICD-10-CM | POA: Insufficient documentation

## 2022-10-16 DIAGNOSIS — R509 Fever, unspecified: Secondary | ICD-10-CM | POA: Insufficient documentation

## 2022-10-16 LAB — RESP PANEL BY RT-PCR (FLU A&B, COVID) ARPGX2
Influenza A by PCR: NEGATIVE
Influenza B by PCR: NEGATIVE
SARS Coronavirus 2 by RT PCR: NEGATIVE
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
## 2022-10-16 NOTE — Discharge Instructions (Signed)
We will contact you if your COVID/influenza test is positive.  Please quarantine while you wait for the results.  If your test is negative you may resume normal activities.  If your test is positive please continue to quarantine for at least 5 days from your symptom onset or until you are without a fever for at least 24 hours after the medications.    If your were prescribed medication, stop by the pharmacy to pick them up.   You can take Tylenol and/or Ibuprofen as needed for fever reduction and pain relief.    For cough: honey 1/2 to 1 teaspoon (you can dilute the honey in water or another fluid).  You can also use guaifenesin and dextromethorphan for cough. You can use a humidifier for chest congestion and cough.  If you don't have a humidifier, you can sit in the bathroom with the hot shower running.      For sore throat: try warm salt water gargles, Mucinex sore throat cough drops or cepacol lozenges, throat spray, warm tea or water with lemon/honey, popsicles or ice, or OTC cold relief medicine for throat discomfort. You can also purchase chloraseptic spray at the pharmacy or dollar store.   For congestion: take a daily anti-histamine like Zyrtec, Claritin, and a oral decongestant, such as pseudoephedrine.  You can also use Flonase 1-2 sprays in each nostril daily. Afrin is also a good option, if you do not have high blood pressure.    It is important to stay hydrated: drink plenty of fluids (water, gatorade/powerade/pedialyte, juices, or teas) to keep your throat moisturized and help further relieve irritation/discomfort.    Return or go to the Emergency Department if symptoms worsen or do not improve in the next few days  

## 2022-10-16 NOTE — ED Triage Notes (Signed)
Pt is with her son  Pt c/o being around covid positive friend last week abd asks for them to be tested for covid.  Pt states that she was sick and vomiting on Saturday but otherwise she is fine.   Pt states that her son took a home test last night and it was positive and she asks for herself to be swabbed to be sure.

## 2022-11-29 DIAGNOSIS — K8071 Calculus of gallbladder and bile duct without cholecystitis with obstruction: Secondary | ICD-10-CM | POA: Diagnosis not present

## 2022-11-29 DIAGNOSIS — K828 Other specified diseases of gallbladder: Secondary | ICD-10-CM | POA: Diagnosis not present

## 2022-11-29 DIAGNOSIS — R1011 Right upper quadrant pain: Secondary | ICD-10-CM | POA: Diagnosis not present

## 2022-11-29 DIAGNOSIS — F419 Anxiety disorder, unspecified: Secondary | ICD-10-CM | POA: Diagnosis not present

## 2022-11-29 DIAGNOSIS — R112 Nausea with vomiting, unspecified: Secondary | ICD-10-CM | POA: Diagnosis not present

## 2022-11-29 DIAGNOSIS — R1013 Epigastric pain: Secondary | ICD-10-CM | POA: Diagnosis not present

## 2022-11-29 DIAGNOSIS — R197 Diarrhea, unspecified: Secondary | ICD-10-CM | POA: Diagnosis not present

## 2022-11-29 DIAGNOSIS — K8020 Calculus of gallbladder without cholecystitis without obstruction: Secondary | ICD-10-CM | POA: Diagnosis not present

## 2022-11-29 DIAGNOSIS — Z87891 Personal history of nicotine dependence: Secondary | ICD-10-CM | POA: Diagnosis not present

## 2022-11-30 DIAGNOSIS — F419 Anxiety disorder, unspecified: Secondary | ICD-10-CM | POA: Diagnosis not present

## 2022-11-30 DIAGNOSIS — K8020 Calculus of gallbladder without cholecystitis without obstruction: Secondary | ICD-10-CM | POA: Diagnosis not present

## 2022-11-30 DIAGNOSIS — K8050 Calculus of bile duct without cholangitis or cholecystitis without obstruction: Secondary | ICD-10-CM | POA: Diagnosis not present

## 2022-11-30 DIAGNOSIS — Z87891 Personal history of nicotine dependence: Secondary | ICD-10-CM | POA: Diagnosis not present

## 2022-11-30 DIAGNOSIS — K8070 Calculus of gallbladder and bile duct without cholecystitis without obstruction: Secondary | ICD-10-CM | POA: Diagnosis not present

## 2022-12-01 DIAGNOSIS — K8050 Calculus of bile duct without cholangitis or cholecystitis without obstruction: Secondary | ICD-10-CM | POA: Diagnosis not present

## 2022-12-01 DIAGNOSIS — Z0279 Encounter for issue of other medical certificate: Secondary | ICD-10-CM | POA: Diagnosis not present

## 2022-12-03 DIAGNOSIS — K8050 Calculus of bile duct without cholangitis or cholecystitis without obstruction: Secondary | ICD-10-CM | POA: Diagnosis not present

## 2022-12-03 DIAGNOSIS — K810 Acute cholecystitis: Secondary | ICD-10-CM | POA: Diagnosis not present

## 2022-12-12 DIAGNOSIS — Z09 Encounter for follow-up examination after completed treatment for conditions other than malignant neoplasm: Secondary | ICD-10-CM | POA: Diagnosis not present

## 2022-12-12 DIAGNOSIS — F419 Anxiety disorder, unspecified: Secondary | ICD-10-CM | POA: Diagnosis not present

## 2022-12-12 DIAGNOSIS — Z1159 Encounter for screening for other viral diseases: Secondary | ICD-10-CM | POA: Diagnosis not present

## 2022-12-12 DIAGNOSIS — Z0000 Encounter for general adult medical examination without abnormal findings: Secondary | ICD-10-CM | POA: Diagnosis not present

## 2022-12-12 DIAGNOSIS — F32A Depression, unspecified: Secondary | ICD-10-CM | POA: Diagnosis not present

## 2023-02-05 ENCOUNTER — Ambulatory Visit
Admission: EM | Admit: 2023-02-05 | Discharge: 2023-02-05 | Disposition: A | Discharge status: Home / Self Care | Payer: Medicaid Other | Attending: Physician Assistant | Admitting: Physician Assistant

## 2023-02-05 DIAGNOSIS — J029 Acute pharyngitis, unspecified: Secondary | ICD-10-CM | POA: Diagnosis present

## 2023-02-05 DIAGNOSIS — J039 Acute tonsillitis, unspecified: Secondary | ICD-10-CM | POA: Insufficient documentation

## 2023-02-05 DIAGNOSIS — R509 Fever, unspecified: Secondary | ICD-10-CM | POA: Insufficient documentation

## 2023-02-05 LAB — GROUP A STREP BY PCR: Group A Strep by PCR: NOT DETECTED

## 2023-02-05 MED ORDER — AMOXICILLIN 500 MG PO CAPS: 500.0000 mg | ORAL_CAPSULE | Freq: Two times a day (BID) | ORAL | 0 refills | Status: AC
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
## 2023-02-05 NOTE — Discharge Instructions (Signed)
-  Given your exam with tonsil swelling, redness, and white tonsillar discharge, will go ahead and put you on a course of antibiotics -Amoxicillin 1 tablet twice a day for 7 days -Ibuprofen and/or Tylenol as needed for pain/fever -Can use tea with honey or warm salt water gargles to help with pain -Follow-up with primary care or this clinic as needed should symptoms worsen or not improve

## 2023-02-05 NOTE — ED Triage Notes (Signed)
Pt presents with c/o fever, sore throat, body aches X 1 day.   States she has taken DaQyuil and Motrin, has not given relief.

## 2023-06-27 DIAGNOSIS — F419 Anxiety disorder, unspecified: Secondary | ICD-10-CM | POA: Diagnosis not present

## 2023-07-11 DIAGNOSIS — F419 Anxiety disorder, unspecified: Secondary | ICD-10-CM | POA: Insufficient documentation

## 2023-07-16 DIAGNOSIS — F411 Generalized anxiety disorder: Secondary | ICD-10-CM | POA: Diagnosis not present

## 2023-08-13 DIAGNOSIS — F411 Generalized anxiety disorder: Secondary | ICD-10-CM | POA: Diagnosis not present

## 2023-08-23 ENCOUNTER — Ambulatory Visit
Admission: EM | Admit: 2023-08-23 | Discharge: 2023-08-23 | Discharge status: Against Medical Advice | Payer: Medicaid Other

## 2023-08-25 ENCOUNTER — Ambulatory Visit
Admission: EM | Admit: 2023-08-25 | Discharge: 2023-08-25 | Disposition: A | Discharge status: Home / Self Care | Payer: Medicaid Other | Attending: Emergency Medicine | Admitting: Emergency Medicine

## 2023-08-25 DIAGNOSIS — K047 Periapical abscess without sinus: Secondary | ICD-10-CM

## 2023-08-25 MED ORDER — AMOXICILLIN-POT CLAVULANATE 875-125 MG PO TABS: 1.0000 | ORAL_TABLET | Freq: Two times a day (BID) | ORAL | 0 refills | Status: DC

## 2023-08-25 NOTE — ED Triage Notes (Signed)
Pt is with her mom  Pt c/o possible dental infection and asks for antibiotics.   Pt has had dental pain x4days  Pt has been using old antibiotics from a previous prescription - antibiotics. They expired in January.  Pt has a dentist appointment on Friday.

## 2023-08-25 NOTE — Discharge Instructions (Addendum)
You were seen for dental pain and are being treated for dental abscess.   - Take the antibiotics as prescribed until they're finished. If you think you're having a reaction, stop the medication, take benadryl and go to the nearest urgent care/emergency room. Take a probiotic while taking the antibiotic to decrease the chances of stomach upset.  -Take high-dose ibuprofen such as 600 or 800 mg 3 times a day with food to help with the pain. -Warm salt water gargles/swishes can help. -Brush daily.  Take care, Dr. Sharlet Salina, NP-c
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
## 2023-08-27 DIAGNOSIS — F411 Generalized anxiety disorder: Secondary | ICD-10-CM | POA: Diagnosis not present

## 2023-10-11 ENCOUNTER — Ambulatory Visit: Payer: Self-pay

## 2024-05-20 NOTE — Progress Notes (Unsigned)
    NURSE VISIT NOTE  Subjective:    Patient ID: Natasha Porter, female    DOB: [birth year], 28 y.o.   MRN: [id number]  HPI  Patient is a 28 y.o. G30P1001 female who presents for evaluation of amenorrhea. She believes she could be pregnant. Pregnancy is desired. Sexual Activity: single partner, contraception: none. Current symptoms also include: breast tenderness, fatigue, and frequent urination. Last period was normal.    Objective:    BP (!) 124/92   Pulse 89   Ht 5' 4 (1.626 m)   Wt 183 lb 1.6 oz (83.1 kg)   LMP 04/11/2024   BMI 31.43 kg/m   Lab Review  No results found for any visits on 05/21/24.  Assessment:   1. Amenorrhea   2. Encounter for antenatal screening for uncertain dates     Plan:   Pregnancy Test: Positive   Patient does plan to deliver at [facility], will schedule dating ultrasound and NOB intake.   Mathis LITTIE Getting, CMA

## 2024-05-21 ENCOUNTER — Ambulatory Visit

## 2024-05-21 VITALS — BP 124/92 | HR 89 | Ht 64.0 in | Wt 183.1 lb

## 2024-05-21 DIAGNOSIS — N912 Amenorrhea, unspecified: Secondary | ICD-10-CM

## 2024-05-21 DIAGNOSIS — Z3687 Encounter for antenatal screening for uncertain dates: Secondary | ICD-10-CM

## 2024-05-21 DIAGNOSIS — Z3201 Encounter for pregnancy test, result positive: Secondary | ICD-10-CM

## 2024-05-21 LAB — POCT URINE PREGNANCY: Preg Test, Ur: POSITIVE — AB

## 2024-05-21 NOTE — Patient Instructions (Signed)
 Common Medications Safe in Pregnancy  Acne:      Constipation:  Benzoyl Peroxide     Colace  Clindamycin      Dulcolax Suppository  Topica Erythromycin     Fibercon  Salicylic Acid      Metamucil         Miralax AVOID:        Senakot   Accutane    Cough:  Retin-A       Cough Drops  Tetracycline      Phenergan w/ Codeine if Rx  Minocycline      Robitussin (Plain & DM)  Antibiotics:     Crabs/Lice:  Ceclor       RID  Cephalosporins    AVOID:  E-Mycins      Kwell  Keflex  Macrobid /Macrodantin    Diarrhea:  Penicillin      Kao-Pectate  Zithromax      Imodium AD         PUSH FLUIDS AVOID:       Cipro     Fever:  Tetracycline      Tylenol  (Regular or Extra  Minocycline       Strength)  Levaquin      Extra Strength-Do not          Exceed 8 tabs/24 hrs Caffeine:        200mg /day (equiv. To 1 cup of coffee or  approx. 3 12 oz sodas)         Gas: Cold/Hayfever:       Gas-X  Benadryl       Mylicon  Claritin        Phazyme  **Claritin -D        Chlor-Trimeton    Headaches:  Dimetapp      ASA-Free Excedrin  Drixoral-Non-Drowsy     Cold Compress  Mucinex (Guaifenasin)     Tylenol  (Regular or Extra  Sudafed/Sudafed-12 Hour     Strength)  **Sudafed PE Pseudoephedrine   Tylenol  Cold & Sinus     Vicks Vapor Rub  Zyrtec  **AVOID if Problems With Blood Pressure         Heartburn: Avoid lying down for at least 1 hour after meals  Aciphex      Maalox     Rash:  Milk of Magnesia     Benadryl     Mylanta       1% Hydrocortisone Cream  Pepcid  Pepcid Complete   Sleep Aids:  Prevacid      Ambien    Prilosec       Benadryl   Rolaids       Chamomile Tea  Tums (Limit 4/day)     Unisom         Tylenol  PM         Warm milk-add vanilla or  Hemorrhoids:       Sugar for taste  Anusol/Anusol H.C.  (RX: Analapram 2.5%)  Sugar Substitutes:  Hydrocortisone OTC     Ok in moderation  Preparation H      Tucks        Vaseline lotion applied to tissue with  wiping    Herpes:     Throat:  Acyclovir      Oragel  Famvir  Valtrex     Vaccines:         Flu Shot Leg Cramps:       *Gardasil  Benadryl       Hepatitis A         Hepatitis B Nasal Spray:  Pneumovax  Saline Nasal Spray     Polio Booster         Tetanus Nausea:       Tuberculosis test or PPD  Vitamin B6 25 mg TID   AVOID:    Dramamine      *Gardasil  Emetrol       Live Poliovirus  Ginger Root 250 mg QID    MMR (measles, mumps &  High Complex Carbs @ Bedtime    rebella)  Sea Bands-Accupressure    Varicella (Chickenpox)  Unisom 1/2 tab TID     *No known complications           If received before Pain:         Known pregnancy;   Darvocet       Resume series after  Lortab        Delivery  Percocet    Yeast:   Tramadol      Femstat   Tylenol  3      Gyne-lotrimin  Ultram       Monistat  Vicodin           MISC:         All Sunscreens           Hair Coloring/highlights          Insect Repellant's          (Including DEET)         Mystic Tans

## 2024-06-05 ENCOUNTER — Telehealth

## 2024-06-05 DIAGNOSIS — Z3480 Encounter for supervision of other normal pregnancy, unspecified trimester: Secondary | ICD-10-CM | POA: Insufficient documentation

## 2024-06-05 DIAGNOSIS — Z3689 Encounter for other specified antenatal screening: Secondary | ICD-10-CM

## 2024-06-05 DIAGNOSIS — O0990 Supervision of high risk pregnancy, unspecified, unspecified trimester: Secondary | ICD-10-CM | POA: Insufficient documentation
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
## 2024-06-05 NOTE — Patient Instructions (Signed)
 First Trimester of Pregnancy  The first trimester of pregnancy starts on the first day of your last monthly period until the end of week 13. This is months 1 through 3 of pregnancy. A week after a sperm fertilizes an egg, the egg will implant into the wall of the uterus and begin to develop into a baby. Body changes during your first trimester Your body goes through many changes during pregnancy. The changes usually return to normal after your baby is born. Physical changes Your breasts may grow larger and may hurt. The area around your nipples may get darker. Your periods will stop. Your hair and nails may grow faster. You may pee more often. Health changes You may tire easily. Your gums may bleed and may be sensitive when you brush and floss. You may not feel hungry. You may have heartburn. You may throw up or feel like you may throw up. You may want to eat some foods, but not others. You may have headaches. You may have trouble pooping (constipation). Other changes Your emotions may change from day to day. You may have more dreams. Follow these instructions at home: Medicines Talk to your health care provider if you're taking medicines. Ask if the medicines are safe to take during pregnancy. Your provider may change the medicines that you take. Do not take any medicines unless told to by your provider. Take a prenatal vitamin that has at least 600 micrograms (mcg) of folic acid. Do not use herbal medicines, illegal substances, or medicines that are not approved by your provider. Eating and drinking While you're pregnant your body needs extra food for your growing baby. Talk with your provider about what to eat while pregnant. Activity Most women are able to exercise during pregnancy. Exercises may need to change as your pregnancy goes on. Talk to your provider about your activities and exercise routines. Relieving pain and discomfort Wear a good, supportive bra if your breasts  hurt. Rest with your legs raised if you have leg cramps or low back pain. Safety Wear your seatbelt at all times when you're in a car. Talk to your provider if someone hits you, hurts you, or yells at you. Talk with your provider if you're feeling sad or have thoughts of hurting yourself. Lifestyle Certain things can be harmful while you're pregnant. Follow these rules: Do not use hot tubs, steam rooms, or saunas. Do not douche. Do not use tampons or scented pads. Do not drink alcohol,smoke, vape, or use products with nicotine or tobacco in them. If you need help quitting, talk with your provider. Avoid cat litter boxes and soil used by cats. These things carry germs that can cause harm to your pregnancy and your baby. General instructions Keep all follow-up visits. It helps you and your unborn baby stay as healthy as possible. Write down your questions. Take them to your visits. Your provider will: Talk with you about your overall health. Give you advice or refer you to specialists who can help with different needs, including: Prenatal education classes. Mental health and counseling. Foods and healthy eating. Ask for help if you need help with food. Call your dentist and ask to be seen. Brush your teeth with a soft toothbrush. Floss gently. Where to find more information American Pregnancy Association: americanpregnancy.org [employer]: acog.org Office on [employer] Health: [employer] Contact a health care provider if: You feel dizzy, faint, or have a fever. You vomit or have watery poop (diarrhea) for 2  days or more. You have abnormal discharge or bleeding from your vagina. You have pain when you pee or your pee smells bad. You have cramps, pain, or pressure in your belly area. Get help right away if: You have trouble breathing or chest pain. You have any kind of injury, such as from a fall or a car crash. These symptoms may be an  emergency. Get help right away. Call 911. Do not wait to see if the symptoms will go away. Do not drive yourself to the hospital. This information is not intended to replace advice given to you by your health care provider. Make sure you discuss any questions you have with your health care provider. Document Revised: 07/18/2023 Document Reviewed: 02/15/2023 Elsevier Patient Education  2024 Elsevier Inc.   Common Medications Safe in Pregnancy  Acne:      Constipation:  Benzoyl Peroxide     Colace  Clindamycin      Dulcolax Suppository  Topica Erythromycin     Fibercon  Salicylic Acid      Metamucil         Miralax AVOID:        Senakot   Accutane    Cough:  Retin-A       Cough Drops  Tetracycline      Phenergan w/ Codeine if Rx  Minocycline      Robitussin (Plain & DM)  Antibiotics:     Crabs/Lice:  Ceclor       RID  Cephalosporins    AVOID:  E-Mycins      Kwell  Keflex  Macrobid/Macrodantin   Diarrhea:  Penicillin      Kao-Pectate  Zithromax      Imodium AD         PUSH FLUIDS AVOID:       Cipro     Fever:  Tetracycline      Tylenol (Regular or Extra  Minocycline       Strength)  Levaquin      Extra Strength-Do not          Exceed 8 tabs/24 hrs Caffeine:        200mg /day (equiv. To 1 cup of coffee or  approx. 3 12 oz sodas)         Gas: Cold/Hayfever:       Gas-X  Benadryl      Mylicon  Claritin       Phazyme  **Claritin-D        Chlor-Trimeton    Headaches:  Dimetapp      ASA-Free Excedrin  Drixoral-Non-Drowsy     Cold Compress  Mucinex (Guaifenasin)     Tylenol (Regular or Extra  Sudafed/Sudafed-12 Hour     Strength)  **Sudafed PE Pseudoephedrine   Tylenol Cold & Sinus     Vicks Vapor Rub  Zyrtec  **AVOID if Problems With Blood Pressure         Heartburn: Avoid lying down for at least 1 hour after meals  Aciphex      Maalox     Rash:  Milk of Magnesia     Benadryl    Mylanta       1% Hydrocortisone Cream  Pepcid  Pepcid Complete   Sleep  Aids:  Prevacid      Ambien   Prilosec       Benadryl  Rolaids       Chamomile Tea  Tums (Limit 4/day)     Unisom  Tylenol PM         Warm milk-add vanilla or  Hemorrhoids:       Sugar for taste  Anusol/Anusol H.C.  (RX: Analapram 2.5%)  Sugar Substitutes:  Hydrocortisone OTC     Ok in moderation  Preparation H      Tucks        Vaseline lotion applied to tissue with wiping    Herpes:     Throat:  Acyclovir      Oragel  Famvir  Valtrex     Vaccines:         Flu Shot Leg Cramps:       *Gardasil  Benadryl      Hepatitis A         Hepatitis B Nasal Spray:       Pneumovax  Saline Nasal Spray     Polio Booster         Tetanus Nausea:       Tuberculosis test or PPD  Vitamin B6 25 mg TID   AVOID:    Dramamine      *Gardasil  Emetrol       Live Poliovirus  Ginger Root 250 mg QID    MMR (measles, mumps &  High Complex Carbs @ Bedtime    rebella)  Sea Bands-Accupressure    Varicella (Chickenpox)  Unisom 1/2 tab TID     *No known complications           If received before Pain:         Known pregnancy;   Darvocet       Resume series after  Lortab        Delivery  Percocet    Yeast:   Tramadol      Femstat  Tylenol 3      Gyne-lotrimin  Ultram       Monistat  Vicodin           MISC:         All Sunscreens           Hair Coloring/highlights          Insect Repellant's          (Including DEET)         Mystic Tans   Commonly Asked Questions During Pregnancy   Cats: A parasite can be excreted in cat feces.  To avoid exposure you need to have another person empty the little box.  If you must empty the litter box you will need to wear gloves.  Wash your hands after handling your cat.  This parasite can also be found in raw or undercooked meat so this should also be avoided.  Colds, Sore Throats, Flu: Please check your medication sheet to see what you can take for symptoms.  If your symptoms are unrelieved by these medications please call the office.  Dental Work: Most  any dental work Agricultural consultant recommends is permitted.  X-rays should only be taken during the first trimester if absolutely necessary.  Your abdomen should be shielded with a lead apron during all x-rays.  Please notify your provider prior to receiving any x-rays.  Novocaine is fine; gas is not recommended.  If your dentist requires a note from [place] prior to dental work please call the office and we will provide one for you.  Exercise: Exercise is an important part of staying healthy during your pregnancy.  You may continue most exercises you were accustomed to prior to pregnancy.  Later in your pregnancy you will most likely notice you have difficulty with activities requiring balance like riding a bicycle.  It is important that you listen to your body and avoid activities that put you at a higher risk of falling.  Adequate rest and staying well hydrated are a must!  If you have questions about the safety of specific activities ask your provider.    Exposure to Children with illness: Try to avoid obvious exposure; report any symptoms to [place] when noted,  If you have chicken pos, red measles or mumps, you should be immune to these diseases.   Please do not take any vaccines while pregnant unless you have checked with your OB provider.  Fetal Movement: After 28 weeks we recommend you do "kick counts" twice daily.  Lie or sit down in a calm quiet environment and count your baby movements "kicks".  You should feel your baby at least 10 times per hour.  If you have not felt 10 kicks within the first hour get up, walk around and have something sweet to eat or drink then repeat for an additional hour.  If count remains less than 10 per hour notify your provider.  Fumigating: Follow your pest control agent's advice as to how long to stay out of your home.  Ventilate the area well before re-entering.  Hemorrhoids:   Most over-the-counter preparations can be used during pregnancy.  Check your medication to see what is  safe to use.  It is important to use a stool softener or fiber in your diet and to drink lots of liquids.  If hemorrhoids seem to be getting worse please call the office.   Hot Tubs:  Hot tubs Jacuzzis and saunas are not recommended while pregnant.  These increase your internal body temperature and should be avoided.  Intercourse:  Sexual intercourse is safe during pregnancy as long as you are comfortable, unless otherwise advised by your provider.  Spotting may occur after intercourse; report any bright red bleeding that is heavier than spotting.  Labor:  If you know that you are in labor, please go to the hospital.  If you are unsure, please call the office and let us help you decide what to do.  Lifting, straining, etc:  If your job requires heavy lifting or straining please check with your provider for any limitations.  Generally, you should not lift items heavier than that you can lift simply with your hands and arms (no back muscles)  Painting:  Paint fumes do not harm your pregnancy, but may make you ill and should be avoided if possible.  Latex or water based paints have less odor than oils.  Use adequate ventilation while painting.  Permanents & Hair Color:  Chemicals in hair dyes are not recommended as they cause increase hair dryness which can increase hair loss during pregnancy.  " Highlighting" and permanents are allowed.  Dye may be absorbed differently and permanents may not hold as well during pregnancy.  Sunbathing:  Use a sunscreen, as skin burns easily during pregnancy.  Drink plenty of fluids; avoid over heating.  Tanning Beds:  Because their possible side effects are still unknown, tanning beds are not recommended.  Ultrasound Scans:  Routine ultrasounds are performed at approximately 20 weeks.  You will be able to see your baby's general anatomy an if you would like to know the gender this can usually be determined as well.  If it is questionable when you conceived you may  also  receive an ultrasound early in your pregnancy for dating purposes.  Otherwise ultrasound exams are not routinely performed unless there is a medical necessity.  Although you can request a scan we ask that you pay for it when conducted because insurance does not cover " patient request" scans.  Work: If your pregnancy proceeds without complications you may work until your due date, unless your physician or employer advises otherwise.  Round Ligament Pain/Pelvic Discomfort:  Sharp, shooting pains not associated with bleeding are fairly common, usually occurring in the second trimester of pregnancy.  They tend to be worse when standing up or when you remain standing for long periods of time.  These are the result of pressure of certain pelvic ligaments called "round ligaments".  Rest, Tylenol and heat seem to be the most effective relief.  As the womb and fetus grow, they rise out of the pelvis and the discomfort improves.  Please notify the office if your pain seems different than that described.  It may represent a more serious condition.

## 2024-06-25 ENCOUNTER — Ambulatory Visit

## 2024-06-25 DIAGNOSIS — Z3A11 11 weeks gestation of pregnancy: Secondary | ICD-10-CM | POA: Diagnosis not present

## 2024-06-25 DIAGNOSIS — Z3687 Encounter for antenatal screening for uncertain dates: Secondary | ICD-10-CM | POA: Diagnosis not present

## 2024-07-06 ENCOUNTER — Other Ambulatory Visit (HOSPITAL_COMMUNITY)
Admission: RE | Admit: 2024-07-06 | Discharge: 2024-07-06 | Disposition: A | Discharge status: Home / Self Care | Source: Ambulatory Visit | Attending: Obstetrics & Gynecology | Admitting: Obstetrics & Gynecology

## 2024-07-06 ENCOUNTER — Ambulatory Visit (INDEPENDENT_AMBULATORY_CARE_PROVIDER_SITE_OTHER): Admitting: Certified Nurse Midwife

## 2024-07-06 VITALS — BP 120/82 | HR 103 | Wt 187.6 lb

## 2024-07-06 DIAGNOSIS — Z113 Encounter for screening for infections with a predominantly sexual mode of transmission: Secondary | ICD-10-CM | POA: Insufficient documentation

## 2024-07-06 DIAGNOSIS — Z114 Encounter for screening for human immunodeficiency virus [HIV]: Secondary | ICD-10-CM

## 2024-07-06 DIAGNOSIS — Z1329 Encounter for screening for other suspected endocrine disorder: Secondary | ICD-10-CM | POA: Diagnosis not present

## 2024-07-06 DIAGNOSIS — Z3481 Encounter for supervision of other normal pregnancy, first trimester: Secondary | ICD-10-CM | POA: Diagnosis not present

## 2024-07-06 DIAGNOSIS — Z3480 Encounter for supervision of other normal pregnancy, unspecified trimester: Secondary | ICD-10-CM | POA: Diagnosis not present

## 2024-07-06 DIAGNOSIS — Z3A12 12 weeks gestation of pregnancy: Secondary | ICD-10-CM | POA: Diagnosis not present

## 2024-07-06 DIAGNOSIS — Z124 Encounter for screening for malignant neoplasm of cervix: Secondary | ICD-10-CM | POA: Insufficient documentation

## 2024-07-06 DIAGNOSIS — Z3482 Encounter for supervision of other normal pregnancy, second trimester: Secondary | ICD-10-CM

## 2024-07-06 DIAGNOSIS — Z369 Encounter for antenatal screening, unspecified: Secondary | ICD-10-CM | POA: Diagnosis not present

## 2024-07-06 DIAGNOSIS — Z0184 Encounter for antibody response examination: Secondary | ICD-10-CM

## 2024-07-06 DIAGNOSIS — Z131 Encounter for screening for diabetes mellitus: Secondary | ICD-10-CM

## 2024-07-06 DIAGNOSIS — Z1379 Encounter for other screening for genetic and chromosomal anomalies: Secondary | ICD-10-CM

## 2024-07-06 DIAGNOSIS — Z3182 Encounter for Rh incompatibility status: Secondary | ICD-10-CM | POA: Diagnosis not present

## 2024-07-06 DIAGNOSIS — Z363 Encounter for antenatal screening for malformations: Secondary | ICD-10-CM

## 2024-07-06 NOTE — Progress Notes (Unsigned)
 NEW OB HISTORY AND PHYSICAL  SUBJECTIVE:       Natasha Porter is a 28 y.o. G2P1001 female, Patient's last menstrual period was 04/11/2024 (approximate)., Estimated Date of Delivery: 01/16/25, [redacted]w[redacted]d, presents today for establishment of Prenatal Care. She reports {:[id number]}   Social history Partner/Relationship: Living situation: Work: Exercise: Substance use:  Indications for ASA therapy (per uptodate) One of the following: Previous pregnancy with preeclampsia, especially early onset and with an adverse outcome {yes/no:20286} Multifetal gestation {yes/no:20286} Chronic hypertension {yes/no:20286} Type 1 or 2 diabetes mellitus {yes/no:20286} Chronic kidney disease {yes/no:20286} Autoimmune disease (antiphospholipid syndrome, systemic lupus erythematosus) {yes/no:20286}  Two or more of the following: Nulliparity {yes/no:20286} Obesity (body mass index >30 kg/m2) {yes/no:20286} Family history of preeclampsia in mother or sister {yes/no:20286} Age >=35 years {yes/no:20286} Sociodemographic characteristics (African American race, low socioeconomic level) {yes/no:20286} Personal risk factors (eg, previous pregnancy with low birth weight or small for gestational age infant, previous adverse pregnancy outcome [eg, stillbirth], interval >10 years between pregnancies) {yes/no:20286}  Past Medical History:  Diagnosis Date  . Anemia   . Anxiety   . Marijuana use   . Panic attacks    Family History  Problem Relation Age of Onset  . Other Mother        unknown medical history  . Mental illness Father   . Hypertension Paternal Grandmother   . Diabetes Paternal Grandmother     Gynecologic History Patient's last menstrual period was 04/11/2024 (approximate). {Blank multiple:19196::Normal,Abnormal,Unknown} Contraception: {method:5051} Last Pap: ***. Results were: {norm/abn:16337}  Obstetric History OB History  Gravida Para Term Preterm AB Living  2 1 1   1   SAB IAB Ectopic  Multiple Live Births     0 1    # Outcome Date GA Lbr Len/2nd Weight Sex Type Anes PTL Lv  2 Current           1 Term 01/12/16 [redacted]w[redacted]d 18:19 / 01:01 7 lb 1.9 oz (3.23 kg) M Vag-Vacuum EPI  LIV    Past Medical History:  Diagnosis Date  . Anemia   . Anxiety   . Marijuana use   . Panic attacks     Past Surgical History:  Procedure Laterality Date  . APPENDECTOMY    . CHOLECYSTECTOMY    . WISDOM TOOTH EXTRACTION      Current Outpatient Medications on File Prior to Visit  Medication Sig Dispense Refill  . Prenatal Vit-Fe Fumarate-FA (PRENATAL PO) Take by mouth.     No current facility-administered medications on file prior to visit.    No Known Allergies  Social History   Socioeconomic History  . Marital status: Single    Spouse name: Not on file  . Number of children: 1  . Years of education: Not on file  . Highest education level: Some college, no degree  Occupational History  . Occupation: Dance movement psychotherapist: Teacher, music  Tobacco Use  . Smoking status: Never  . Smokeless tobacco: Never  Vaping Use  . Vaping status: Former  . Substances: Nicotine , Flavoring  Substance and Sexual Activity  . Alcohol use: No  . Drug use: Not Currently    Types: Marijuana    Comment: last use 08/27/20  . Sexual activity: Not Currently    Partners: Male    Birth control/protection: None  Other Topics Concern  . Not on file  Social History Narrative  . Not on file   Social Drivers of Health   Financial Resource Strain: Low Risk  (06/05/2024)  Overall Physicist, medical Strain (CARDIA)   . Difficulty of Paying Living Expenses: Not very hard  Food Insecurity: No Food Insecurity (06/05/2024)   Hunger Vital Sign   . Worried About Programme researcher, broadcasting/film/video in the Last Year: Never true   . Ran Out of Food in the Last Year: Never true  Transportation Needs: No Transportation Needs (06/05/2024)   PRAPARE - Transportation   . Lack of Transportation (Medical): No   . Lack of  Transportation (Non-Medical): No  Physical Activity: Sufficiently Active (06/05/2024)   Exercise Vital Sign   . Days of Exercise per Week: 5 days   . Minutes of Exercise per Session: 60 min  Stress: No Stress Concern Present (06/05/2024)   [employer] of Occupational Health - Occupational Stress Questionnaire   . Feeling of Stress: Not at all  Social Connections: Socially Isolated (06/05/2024)   Social Connection and Isolation Panel   . Frequency of Communication with Friends and Family: Once a week   . Frequency of Social Gatherings with Friends and Family: Once a week   . Attends Religious Services: Never   . Active Member of Clubs or Organizations: No   . Attends Banker Meetings: Not on file   . Marital Status: Never married  Intimate Partner Violence: Not At Risk (06/05/2024)   Humiliation, Afraid, Rape, and Kick questionnaire   . Fear of Current or Ex-Partner: No   . Emotionally Abused: No   . Physically Abused: No   . Sexually Abused: No    Family History  Problem Relation Age of Onset  . Other Mother        unknown medical history  . Mental illness Father   . Hypertension Paternal Grandmother   . Diabetes Paternal Grandmother     The following portions of the patient's history were reviewed and updated as appropriate: allergies, current medications, past OB history, past medical history, past surgical history, past family history, past social history, and problem list.  Constitutional: Denied constitutional symptoms, night sweats, recent illness, fatigue, fever, insomnia and weight loss.  Eyes: Denied eye symptoms, eye pain, photophobia, vision change and visual disturbance.  Ears/Nose/Throat/Neck: Denied ear, nose, throat or neck symptoms, hearing loss, nasal discharge, sinus congestion and sore throat.  Cardiovascular: Denied cardiovascular symptoms, arrhythmia, chest pain/pressure, edema, exercise intolerance, orthopnea and palpitations.  Respiratory:  Denied pulmonary symptoms, asthma, pleuritic pain, productive sputum, cough, dyspnea and wheezing.  Gastrointestinal: Denied gastro-esophageal reflux, melena, nausea and vomiting.  Genitourinary:*** Denied genitourinary symptoms including symptomatic vaginal discharge, pelvic relaxation issues, and urinary complaints.  Musculoskeletal: Denied musculoskeletal symptoms, stiffness, swelling, muscle weakness and myalgia.  Dermatologic: Denied dermatology symptoms, rash and scar.  Neurologic: Denied neurology symptoms, dizziness, headache, neck pain and syncope.  Psychiatric: Denied psychiatric symptoms, anxiety and depression.  Endocrine: Denied endocrine symptoms including hot flashes and night sweats.     OBJECTIVE: Initial Physical Exam (New OB)  Physical Exam     ASSESSMENT: {pregnancy state assessment:313271::Normal pregnancy}   PLAN: Routine prenatal care. We discussed an overview of prenatal care and when to call. Reviewed diet, exercise, and weight gain recommendations in pregnancy. Discussed benefits of breastfeeding and lactation resources at [facility], [place]. I reviewed labs and answered all questions.  There are no diagnoses linked to this encounter.  Harlene LITTIE Cisco, CNM

## 2024-07-06 NOTE — Patient Instructions (Addendum)
 Headache Prevention: -- Magnesium, [phone] mg at night; ; if you experience constipation with pregnancy take Magnesium Citrate (Natural Calm is a good choice); if you do not have concerns about constipation or you have diarrhea take Magnesium Glycinate -- Vitamin B-2, 200mg  twice a day  Acute treatment: -- Benadryl  25-50 mg -- Sudafed 30-60mg  -- Tylenol  650-1000mg  -- Extra Magnesium 750mg  -- Reglan 10mg  twice a day (needs prescription)  First Trimester of Pregnancy  The first trimester of pregnancy starts on the first day of your last monthly period until the end of week 13. This is months 1 through 3 of pregnancy. A week after a sperm fertilizes an egg, the egg will implant into the wall of the uterus and begin to develop into a baby. Body changes during your first trimester Your body goes through many changes during pregnancy. The changes usually return to normal after your baby is born. Physical changes Your breasts may grow larger and may hurt. The area around your nipples may get darker. Your periods will stop. Your hair and nails may grow faster. You may pee more often. Health changes You may tire easily. Your gums may bleed and may be sensitive when you brush and floss. You may not feel hungry. You may have heartburn. You may throw up or feel like you may throw up. You may want to eat some foods, but not others. You may have headaches. You may have trouble pooping (constipation). Other changes Your emotions may change from day to day. You may have more dreams. Follow these instructions at home: Medicines Talk to your health care provider if you're taking medicines. Ask if the medicines are safe to take during pregnancy. Your provider may change the medicines that you take. Do not take any medicines unless told to by your provider. Take a prenatal vitamin that has at least 600 micrograms (mcg) of folic acid. Do not use herbal medicines, illegal substances, or medicines  that are not approved by your provider. Eating and drinking While you're pregnant your body needs extra food for your growing baby. Talk with your provider about what to eat while pregnant. Activity Most women are able to exercise during pregnancy. Exercises may need to change as your pregnancy goes on. Talk to your provider about your activities and exercise routines. Relieving pain and discomfort Wear a good, supportive bra if your breasts hurt. Rest with your legs raised if you have leg cramps or low back pain. Safety Wear your seatbelt at all times when you're in a car. Talk to your provider if someone hits you, hurts you, or yells at you. Talk with your provider if you're feeling sad or have thoughts of hurting yourself. Lifestyle Certain things can be harmful while you're pregnant. Follow these rules: Do not use hot tubs, steam rooms, or saunas. Do not douche. Do not use tampons or scented pads. Do not drink alcohol,smoke, vape, or use products with nicotine  or tobacco in them. If you need help quitting, talk with your provider. Avoid cat litter boxes and soil used by cats. These things carry germs that can cause harm to your pregnancy and your baby. General instructions Keep all follow-up visits. It helps you and your unborn baby stay as healthy as possible. Write down your questions. Take them to your visits. Your provider will: Talk with you about your overall health. Give you advice or refer you to specialists who can help with different needs, including: Prenatal education classes. Mental health and counseling. Foods and  healthy eating. Ask for help if you need help with food. Call your dentist and ask to be seen. Brush your teeth with a soft toothbrush. Floss gently. Where to find more information American Pregnancy Association: americanpregnancy.org [employer]: acog.org Office on [employer] Health: TravelLesson.ca Contact a health  care provider if: You feel dizzy, faint, or have a fever. You vomit or have watery poop (diarrhea) for 2 days or more. You have abnormal discharge or bleeding from your vagina. You have pain when you pee or your pee smells bad. You have cramps, pain, or pressure in your belly area. Get help right away if: You have trouble breathing or chest pain. You have any kind of injury, such as from a fall or a car crash. These symptoms may be an emergency. Get help right away. Call 911. Do not wait to see if the symptoms will go away. Do not drive yourself to the hospital. This information is not intended to replace advice given to you by your health care provider. Make sure you discuss any questions you have with your health care provider. Document Revised: 07/18/2023 Document Reviewed: 02/15/2023 Elsevier Patient Education  2024 [employer]. [employer] Gain During Pregnancy Gaining some weight during pregnancy is normal and healthy. The amount of weight you should gain depends on your health and weight before pregnancy. Talk with your health care provider to find out the right amount of weight gain for you. The suggested weight gain is based on your body mass index, or BMI. Here's a general guide based on your weight before pregnancy: If you're underweight or have a BMI less than 18.5, you should gain 28-40 lb (13-18 kg). If you're at a normal weight with a BMI of 18.5-24.9, you should gain 25-35 lb (11-16 kg). If you're overweight with a BMI of 25-29.9, you should gain 15-25 lb (7-11 kg). If you're obese with a BMI of 30 or higher, you should gain 11-20 lb (5-9 kg). Your provider may suggest that you try to gain weight slower or gain more weight based on what's best for your baby and your health. What are the risks of unhealthy weight gain for me? Gaining too much weight during pregnancy can lead to: A type of diabetes that can happen during pregnancy. This is called gestational  diabetes. Hypertensive disorders of pregnancy. These are problems that cause high blood pressure. Having a difficult delivery or a C-section. What are the risks of unhealthy weight gain for my baby? Gaining too little weight during pregnancy can lead to: Loss of the pregnancy. An early (preterm) birth. Your baby not growing normally. Your baby having a low weight at birth. Gaining too much weight during pregnancy can lead to: Your baby growing larger than normal during pregnancy. Your baby having an increased risk of obesity. What actions can I take to gain a healthy amount of weight during pregnancy? Nutrition  Eat healthy foods. Every day, try to eat: Fruits and vegetables of various colors and kinds. Whole grains, like whole-wheat breads and oatmeal. Low-fat dairy foods such as yogurt, milk, and cheese. Or try non-dairy alternatives from soy or almond. Protein-rich foods, like lean meat, chicken, eggs, and beans. Avoid foods that are fried or have a lot of fat, salt, or sugar. Drink more fluids as told. Choose healthy snacks and drinks: Drink water. Avoid soda, sports drinks, and juices that have added sugar. Avoid drinks with caffeine, such as coffee and energy drinks. Eat snacks that are  high in protein, such as nuts, protein bars, and low-fat yogurt. Carry snacks with you that don't need refrigeration, such as trail mix, an apple, or a bar. If you need help improving your diet, work with your provider or an expert in healthy eating called a dietitian. Activity  Exercise regularly, as told by your provider. If you were active before you became pregnant, you may be able to continue your regular fitness activities. If you were not active before pregnancy, you may slowly build up to exercising for 30 or more minutes on most days of the week. This may include walking, swimming, or yoga. Ask your provider what activities are safe for you. Follow these instructions at home: Take  medicines only as told. Take all prenatal supplements as told. Keep track of your weight gain during pregnancy. Keep all prenatal health care visits. These visits are a good time to discuss your weight gain. Your provider will check on your health and the health of your baby. Where to find support If you have questions or need help learning about healthy weight gain during pregnancy, these people may help: Your health care provider. A dietitian. Where to find more information To learn more: Go to bitchilla.com. Click Search and type weight gain. Find the link you need. Contact a health care provider if: You're not able to eat or drink for longer than 24 hours. You can't afford food or have trouble getting regular meals. This information is not intended to replace advice given to you by your health care provider. Make sure you discuss any questions you have with your health care provider. Document Revised: 09/06/2023 Document Reviewed: 09/06/2023 Elsevier Patient Education  2025 [employer]. Genetic Testing During Pregnancy: What to Know Genetic testing is done when you're pregnant to check if your baby might have a congenital condition. A congenital condition is something a baby is born with, also called a birth condition. These conditions can happen when genes or chromosomes are not normal. Genes are tiny parts in your body that make up chromosomes. Chromosomes are groups of many genes. Together, they tell your body how to look and work. Genes are passed down from parents to their baby. Why is genetic testing done during pregnancy? Genetic testing allows you to: Talk about your test results and future plans with your health care team. Plan for a baby that may be born with a congenital condition. Make plans with your health care team in case your baby needs special care before or after birth. Think about your options regarding whether you want to continue with the  pregnancy. Types of genetic tests A genetic test can be a screening test or a diagnostic test. Screening tests     Screening tests are used to check the risk of your baby having a congenital condition. They don't show if your baby actually has the condition. More testing will be needed to know for sure. Screening tests are recommended for all pregnant people. Screening tests will not hurt your baby. Types of screening tests include: Carrier screening. The parents' blood or saliva is tested to check for genes that aren't normal. These genes can be passed to the baby. If both parents have the gene, the baby is at risk. First-trimester screening. This includes a maternal blood test and an ultrasound of your baby. This test checks for a risk of conditions related to chromosomes. It also looks for problems with your baby's heart, belly, or bones. Second-trimester screening. This may include a  maternal blood test and an ultrasound of your baby. This test checks for the risk of conditions related to chromosomes. It also looks for problems with many parts of your baby's body. These include the brain, nose, mouth, spine, heart, and arms or legs. Some people may only have an ultrasound and not have a blood test. Combined or sequential screening. This looks at the results from the blood tests in the first and second trimesters, along with the findings of the first-trimester ultrasound. It helps to tell you more about your baby. This type of testing may be more accurate than just doing screening in the first or second trimester by itself. Cell-free DNA testing. During pregnancy, cells from your placenta get into your blood, which is normal. This test is a blood test that looks at those cells. It's done after 10 weeks of pregnancy. It can be used to check for the risk of conditions caused by having too many chromosomes or an abnormal number of sex chromosomes.  Diagnostic tests Diagnostic tests are done only if  your baby is known to be at risk of having a congenital condition. These tests check the cells from your baby to diagnose a condition. Examples of these tests include: Chorionic villus sampling (CVS). This is a procedure where cells are taken from the placenta for testing. To do this, a needle is put into your belly using guided ultrasound. Amniocentesis. This is a procedure where amniotic fluid is removed from the sac around your baby. The cells from the placenta or amniotic fluid are tested for chromosomes that are not normal. What do the results mean? For a screening test: If your results are negative, it means that your baby is most likely not at a higher risk for a condition. There's still a small chance your baby could have a condition. If your results are positive, it means that your baby's risk for a condition is higher than normal. Your health care provider may want you to have a diagnostic test. For a diagnostic test: If the result is negative, it's not likely that your baby will have a condition. If the test is positive, your baby most likely has a condition. Talk with your provider about what your results mean and what your options are. Questions to ask your health care provider Talk with your provider about the conditions that run in your family. Ask these questions: Is my baby at risk for a congenital condition? What are the benefits of having genetic screening? Should I meet with a genetic counselor? Should my partner or other members of my family be tested? What tests are best for me and my baby? How much do the tests cost? Will my insurance cover the testing? What are the risks of each test? This information is not intended to replace advice given to you by your health care provider. Make sure you discuss any questions you have with your health care provider. Document Revised: 09/05/2023 Document Reviewed: 09/05/2023 Elsevier Patient Education  2025 [employer]. Pregnancy:  Healthy Eating While you're pregnant, your body needs extra nutrition for your growing baby. You also need more vitamins and minerals, such as folic acid, calcium , iron , and vitamin D. Eating a balanced diet is important for both you and your baby. Your need for extra calories will change during pregnancy. During the first 3 months of pregnancy, called the first trimester, you don't need more calories. During the second trimester, you'll need about 340 extra calories a day. During the third  trimester, you'll need about 450 extra calories a day. If you're carrying more than one baby, talk with your health care provider or a dietitian to learn more about your specific eating needs. What are tips for eating healthy during pregnancy? Meal planning  Eating smaller meals throughout the day may help manage some side effects common in pregnancy, like heartburn and reflux. Eat a variety of foods. Be sure to include many types of fruits and vegetables. Two or more servings of fish are recommended each week. Choose fish that are lower in mercury, such as salmon and pollock. Limit foods that have empty calories. These are foods that have little nutritional value, such as sweets, desserts, candies, and drinks with sugar in them. Drinks that have caffeine are OK to drink, but it's better to avoid caffeine. Limit your total caffeine intake to less than 200 mg each day, or the limit you're told by your provider. Be aware that 200 mg of caffeine is 12 oz or 355 mL of coffee, tea, or soda. General information Take a prenatal vitamin to help meet your vitamin and mineral needs during pregnancy. This includes your need for folic acid, iron , calcium , and vitamin D. Do not try to lose weight or go on a diet during pregnancy. Food safety  Wash your hands before you eat and after you prepare raw meat. Wash all fruits and vegetables well before peeling or eating. Make sure that all meats, poultry, and eggs are  cooked to food-safe temperatures or well-done. Taking these actions can help keep your food safe and protect you and your baby from dangerous food illnesses. Ask your provider for more information. What foods should I eat? Fruits All fruits. Eat a variety of colors and types of fruit. Remember to wash your fruits well before peeling or eating. Vegetables All vegetables. Eat a variety of colors and types of vegetables. Remember to wash your vegetables well before peeling or eating. Grains All grains. Choose whole grains, such as whole-wheat bread, oatmeal, or brown rice. Meats and other protein foods Lean meats, including chicken, [place], and lean cuts of beef, veal, or pork. Fish that is higher in omega-3 fatty acids and lower in mercury, such as salmon, herring, mussels, trout, sardines, pollock, shrimp, crab, and lobster. Tofu. Tempeh. Beans. Eggs. Peanut butter and other nut butters. Dairy Pasteurized milk and milk alternatives, such as soy milk. Pasteurized yogurt and pasteurized cheese. Cottage cheese. Sour cream. Beverages Water. Juices that contain 100% fruit juice or vegetable juice. Caffeine-free teas and decaffeinated coffee. Fats and oils Fats and oils are OK to include in moderation. Sweets and desserts Sweets and desserts are OK to include in moderation. Seasoning and other foods All pasteurized condiments. The items listed above may not be all the foods and drinks you can have. Talk with a dietitian to learn more. What does 340 extra calories look like? Healthy snacks that give you 340 more calories a day could be: Peanut butter and jelly with milk: 8 oz (237 mL) of low-fat milk. Peanut butter and jelly sandwich made with: 1 slice of whole-wheat bread. 2 teaspoons (10 g) of peanut butter. Yogurt and berries: 1 cup (245 g) of [place] yogurt. 1 cup (150 g) of berries. 2 tablespoons (30 g) of chopped nuts, such as almonds or walnuts. Avocado toast: 1 slice of whole-wheat  bread. 1/2 medium avocado (70 g). 1 large egg (50 g). What foods should I avoid? Fruits Raw (unpasteurized) fruit juices. Vegetables Unpasteurized vegetable juices. Meats and  other protein foods Precooked or cured meat, such as bologna, hot dogs, sausages, or meat loaves. (If you must eat those meats, reheat them until they are steaming hot.) Refrigerated pate, meat spreads from a meat counter, or smoked seafood that's found in the refrigerated section of a store. Raw or undercooked meats, poultry, and eggs. Raw fish, such as sushi or sashimi. Fish that have high mercury content, such as tilefish, shark, swordfish, and king mackerel. Dairy Unpasteurized or raw milk and any foods that are made from them. Some of these may be: Homemade yogurts or puddings. Soft cheeses such as: Feta. Queso blanco or fresco. Pharmacist, hospital or [facility]. Blue-veined cheeses. Some of these types of cheeses may be made with pasteurized milk. Check the label. If pasteurized milk is used, they are OK to eat during pregnancy. Deli foods Premade foods from a store or deli, like chicken salad, coleslaw, or egg salad. These are riskier for food illness than fresh or homemade salads. Beverages Alcohol. Sugar-sweetened drinks, such as sodas or teas. Energy drinks. Seasoning and other foods Homemade fermented foods and drinks, such as: Pickles. Sauerkraut. Kombucha. Store-bought pasteurized versions of these are OK. The items listed above may not be all the foods and drinks you should avoid. Talk with a dietitian to learn more. Where to find more information To learn more, go to: Centers for Disease Control and Prevention at TonerPromos.no. Click Search and type food choices for pregnancy. Find the link you need. MyPlate at http://pittman-dennis.biz/. This information is not intended to replace advice given to you by your health care provider. Make sure you discuss any questions you have with your health care  provider. Document Revised: 10/02/2023 Document Reviewed: 10/02/2023 Elsevier Patient Education  2025 [employer]. Second Trimester of Pregnancy  The second trimester of pregnancy is from week 14 through week 27. This is months 4 through 6 of pregnancy. During the second trimester: Morning sickness is less or has stopped. You may have more energy. You may feel hungry more often. At this time, your unborn baby is growing very fast. At the end of the sixth month, the unborn baby may be up to 12 inches long and weigh about 1 pounds. You will likely start to feel the baby move between 16 and 20 weeks of pregnancy. Body changes during your second trimester Your body continues to change during this time. The changes usually go away after your baby is born. Physical changes You will gain more weight. Your belly will get bigger. You may begin to get stretch marks on your hips, belly, and breasts. Your breasts will keep growing and may hurt. You may get dark spots or blotches on your face. A dark line from your belly button to the pubic area may appear. This line is called linea nigra. Your hair may grow faster and get thicker. Health changes You may have headaches. You may have heartburn. You may pee more often. You may have swollen, bulging veins (varicose veins). You may have trouble pooping (constipation), or swollen veins in the butt that can itch or get painful (hemorrhoids). You may have back pain. This is caused by: Weight gain. Pregnancy hormones that are relaxing the joints in your pelvis. Follow these instructions at home: Medicines Talk to your health care provider if you're taking medicines. Ask if the medicines are safe to take during pregnancy. Your provider may change the medicines that you take. Do not take any medicines unless told to by your provider. Take a prenatal vitamin  that has at least 600 micrograms (mcg) of folic acid. Do not use herbal medicines, illegal  drugs, or medicines that are not approved by your provider. Eating and drinking While you're pregnant your body needs extra food for your growing baby. Talk with your provider about what to eat while pregnant. Activity Most women are able to exercise during pregnancy. Exercises may need to change as your pregnancy goes on. Talk to your provider about your activities and exercise routines. Relieving pain and discomfort Wear a good, supportive bra if your breasts hurt. Rest with your legs raised if you have leg cramps or low back pain. Take warm sitz baths to soothe pain from hemorrhoids. Use hemorrhoid cream if your provider says it's okay. Do not douche. Do not use tampons or scented pads. Do not use hot tubs, steam rooms, or saunas. Safety Wear your seatbelt at all times when you're in a car. Talk to your provider if someone hits you, hurts you, or yells at you. Talk with your provider if you're feeling sad or have thoughts of hurting yourself. Lifestyle Certain things can be harmful while you're pregnant. It's best to avoid the following: Do not drink alcohol,smoke, vape, or use products with nicotine  or tobacco in them. If you need help quitting, talk with your provider. Avoid cat litter boxes and soil used by cats. These things carry germs that can cause harm to your pregnancy and your baby. General instructions Keep all follow-up visits. It helps you and your unborn baby stay as healthy as possible. Write down your questions. Take them to your prenatal visits. Your provider will: Talk with you about your overall health. Give you advice or refer you to specialists who can help with different needs, including: Prenatal education classes. Mental health and counseling. Foods and healthy eating. Ask for help if you need help with food. Where to find more information American Pregnancy Association: americanpregnancy.org [employer]:  acog.org Office on [employer] Health: TravelLesson.ca Contact a health care provider if: You have a headache that does not go away when you take medicine. You have any of these problems: You can't eat or drink. You throw up or feel like you may throw up. You have watery poop (diarrhea) for 2 days or more. You have pain when you pee or your pee smells bad. You have been sick for 2 days or more and are not getting better. Contact your provider right away if: You have any of these coming from your vagina: Abnormal discharge. Bad-smelling fluid. Bleeding. Your baby is moving less than usual. You have contractions, belly cramping, or have pain in your pelvis or lower back. You have symptoms of high blood pressure or preeclampsia. These include: A severe, throbbing headache that does not go away. Sudden or extreme swelling of your face, hands, legs, or feet. Vision problems: You see spots. You have blurry vision. Your eyes are sensitive to light. If you can't reach the provider, go to an urgent care or emergency room. Get help right away if: You faint, become confused, or can't think clearly. You have chest pain or trouble breathing. You have any kind of injury, such as from a fall or a car crash. These symptoms may be an emergency. Call 911 right away. Do not wait to see if the symptoms will go away. Do not drive yourself to the hospital. This information is not intended to replace advice given to you by your health care provider. Make sure you discuss  any questions you have with your health care provider. Document Revised: 07/18/2023 Document Reviewed: 02/15/2023 Elsevier Patient Education  2024 Elsevier Inc. Benefits of Breastfeeding Breastfeeding has many health benefits for babies and their breastfeeding parents. Breast milk is the best form of nutrition for babies. Breastfeeding is recommended until a child is 11 years old or older, as long as this is wanted by both the  breastfeeding parent and child. Breastfeeding can be challenging, especially during the first few weeks after your baby is born. Ask your health care provider or breastfeeding specialist (lactation consultant) for more information and help if needed. Asking for help early can provide the support you need for successful breastfeeding. Breastfeeding is not the only way to feed your baby breast milk. Some parents choose not to feed their babies directly from the breast. Instead, they pump and bottle-feed their babies expressed breast milk. How do breastfeeding and pumping benefit me? Breastfeeding and pumping may: Help to create a bond between you and your baby. Slow bleeding after you give birth. Help your uterus return to its size before pregnancy. Help you lose the weight gained during pregnancy. Lower your risk of developing type 2 diabetes, weak bones (osteoporosis), rheumatoid arthritis, cardiovascular disease, and some forms of cancer later in life. How does breast milk benefit my baby? Nutrients in breast milk are better for your baby compared to nutrients in infant formula. Breast milk is easier for your baby to digest. The first milk (colostrum)helps your baby's digestive system function better. Breast milk lowers the risk of problems with the stomach and intestines. This includes necrotizing enterocolitis (NEC) in newborns. NEC is the inflammation and death of tissue in the intestine. Babies born early (premature) are at a higher risk of developing NEC. As your baby grows, your body changes the nutrients in your breast milk to meet your baby's needs. Breast milk improves your baby's brain development. Breast milk lowers your baby's risk for sudden infant death syndrome (SIDS). Breast milk can lower your baby's risk of: Ear infections. Infections of the nose, throat, or airways (respiratory infections). Allergies. Obesity. Diabetes. What are breast milk antibodies? Breast milk  antibodies are substances that are part of the body's disease-fighting system (immune system). Antibodies help your baby fight off infections. Breast milk antibodies protect your baby from illnesses that you have: Had yourself. Been vaccinated against. Been exposed to while breastfeeding or pumping. What are other benefits of breastfeeding and bottle-feeding expressed breast milk? Breast milk is free. Breastfeeding may be convenient. In an emergency situation, formula shortage, or natural disaster, you will be able to feed your baby without the need for safe water or infant formula. If you are exclusively pumping, manual breast pumps are available to express breast milk. You will need to boil pump parts to keep them clean between uses. If you do not have access to clean water or a manual breast pump, you can hand express your milk. Do this by compressing and massaging the milk ducts of each breast and catching the breast milk in a bottle or storage container. Where to find more information [employer]: llli.org Breastfeeding, Centers for Disease Control and Prevention (CDC): TonerPromos.no Breastfeeding and Returning to the Workplace, Marine scientist for Disease Control and Prevention (CDC): TonerPromos.no [employer] of [employer]st[employer]ians [employer]necologists (ACOG) : acog.org WIC Breastfeeding Support: wicbreastfeeding.fns.usda.gov Contact a health care provider if: You are frustrated with breastfeeding. You are worried your baby is not getting enough milk. Signs of not getting enough milk include:  Your baby is not gaining weight or loses weight. Your baby is more than 8 week old and wetting fewer than 6 diapers in a 24-hour period. You do not hear your baby swallowing during feeds. Your full breasts do not get softer after your baby breastfeeds. Your baby is frequently too sleepy to feed well or is crying and will not stop. You have pain or discomfort in your breasts such as: Continued pain with  breastfeeding. Breast engorgement that does not improve after 48-72 hours. Cracking or soreness in your nipples that does not get better with treatment. Bleeding from your nipples. This information is not intended to replace advice given to you by your health care provider. Make sure you discuss any questions you have with your health care provider. Document Revised: 11/01/2022 Document Reviewed: 10/05/2022 Elsevier Patient Education  2024 Elsevier Inc. Problems to Watch for During Pregnancy During pregnancy, your body goes through many changes. Some changes may be uncomfortable. But most changes are not a serious problem. It's important to learn when certain signs and symptoms may be a problem. Talk with your health care provider about any medical conditions you have. Make sure you know the symptoms to watch for. Reporting problems early will prevent complications. Problems to watch for during pregnancy You're more likely to get an infection during pregnancy. Let your provider know if you have signs of infection, such as: A fever. A bad-smelling fluid from your vagina. Peeing too often, wanting to pee urgently, or pain when you pee. Also, let your provider know if: You're very tired, you feel dizzy, or you faint. You have watery poop (diarrhea) for 24 hours or longer. You throw up or feel like throwing up for 24 hours or longer. You have cramping in your belly or have pain in your hips or lower back. You have spotting, bleeding, or leaking of fluid from your vagina. You have pain, swelling, or redness in an arm or leg. You should also watch for signs of high blood pressure and preeclampsia. These signs can be very serious. They include: A headache that doesn't go away when you take medicine. Sudden or very bad swelling of your face, hands, legs, or feet. Problems seeing, such as: You see spots. You have blurry vision. You may be sensitive to light. Why it's important to watch for these  problems Watching and reporting problems to your provider can help prevent complications that may affect you and your baby. These include: Higher risk of giving birth early. Infection that may be passed on to your baby. Higher risk for stillbirth. Follow these instructions at home:  Take your medicines only as told. Keep all follow-up visits. Your provider needs to monitor your health and your baby's health. Where to find more information To learn more, go to these websites: Centers for Disease Control and Prevention (CDC) at TonerPromos.no. Then: Click Health Topics A-Z. Type urgent maternal warning signs in the search box. [employer]stetricians and Gynecologists (ACOG): acog.org Contact a health care provider if: You have any problems while you're pregnant. You feel your baby moving less than usual. You have any of these things: You have strong emotions, such as sadness or anxiety, that affect your daily life. You do not feel safe in your home. You use tobacco, alcohol, or drugs, and you need help to stop. Get help right away if: You faint, have a seizure, or cannot think clearly. You have chest pain or difficulty breathing. You have any of the following  symptoms and you were unable to reach your provider: You have symptoms of infection, including a fever, or have vaginal bleeding. You have symptoms of high blood pressure or preeclampsia. You have signs or symptoms of labor before 37 weeks of pregnancy. These include: Contractions that are 5 minutes or less apart, or that increase in frequency, intensity, or length. Sudden, sharp pain in the belly, or low back pain. Any amount of fluid that flows from your vagina without stopping. These symptoms may be an emergency. Call 911 right away. Do not wait to see if the symptoms will go away. Do not drive yourself to the hospital. This information is not intended to replace advice given to you by your health care provider. Make  sure you discuss any questions you have with your health care provider. Document Revised: 03/26/2023 Document Reviewed: 03/26/2023 Elsevier Patient Education  2024 [employer].

## 2024-07-07 ENCOUNTER — Encounter: Payer: Self-pay | Admitting: Certified Nurse Midwife

## 2024-07-07 DIAGNOSIS — O09899 Supervision of other high risk pregnancies, unspecified trimester: Secondary | ICD-10-CM | POA: Insufficient documentation

## 2024-07-07 LAB — URINALYSIS, ROUTINE W REFLEX MICROSCOPIC
Bilirubin, UA: NEGATIVE
Glucose, UA: NEGATIVE
Ketones, UA: NEGATIVE
Nitrite, UA: NEGATIVE
RBC, UA: NEGATIVE
Specific Gravity, UA: 1.022 (ref 1.005–1.030)
Urobilinogen, Ur: 0.2 mg/dL (ref 0.2–1.0)
pH, UA: 6.5 (ref 5.0–7.5)

## 2024-07-07 LAB — COMPREHENSIVE METABOLIC PANEL WITH GFR
ALT: 16 IU/L (ref 0–32)
AST: 15 IU/L (ref 0–40)
Albumin: 4 g/dL (ref 4.0–5.0)
Alkaline Phosphatase: 63 IU/L (ref 44–121)
BUN/Creatinine Ratio: 13 (ref 9–23)
BUN: 7 mg/dL (ref 6–20)
Bilirubin Total: 0.2 mg/dL (ref 0.0–1.2)
CO2: 19 mmol/L — ABNORMAL LOW (ref 20–29)
Calcium: 9 mg/dL (ref 8.7–10.2)
Chloride: 105 mmol/L (ref 96–106)
Creatinine, Ser: 0.55 mg/dL — ABNORMAL LOW (ref 0.57–1.00)
Globulin, Total: 2.8 g/dL (ref 1.5–4.5)
Glucose: 89 mg/dL (ref 70–99)
Potassium: 3.5 mmol/L (ref 3.5–5.2)
Sodium: 139 mmol/L (ref 134–144)
Total Protein: 6.8 g/dL (ref 6.0–8.5)
eGFR: 130 mL/min/1.73 (ref >59)

## 2024-07-07 LAB — CBC/D/PLT+RPR+RH+ABO+RUBIGG...
Antibody Screen: NEGATIVE
Basophils Absolute: 0 x10E3/uL (ref 0.0–0.2)
Basos: 0 %
EOS (ABSOLUTE): 0.3 x10E3/uL (ref 0.0–0.4)
Eos: 3 %
HCV Ab: NONREACTIVE
HIV Screen 4th Generation wRfx: NONREACTIVE
Hematocrit: 34.4 % (ref 34.0–46.6)
Hemoglobin: 11.3 g/dL (ref 11.1–15.9)
Hepatitis B Surface Ag: NEGATIVE
Immature Grans (Abs): 0 x10E3/uL (ref 0.0–0.1)
Immature Granulocytes: 0 %
Lymphocytes Absolute: 1.9 x10E3/uL (ref 0.7–3.1)
Lymphs: 22 %
MCH: 29.8 pg (ref 26.6–33.0)
MCHC: 32.8 g/dL (ref 31.5–35.7)
MCV: 91 fL (ref 79–97)
Monocytes Absolute: 0.5 x10E3/uL (ref 0.1–0.9)
Monocytes: 6 %
Neutrophils Absolute: 5.9 x10E3/uL (ref 1.4–7.0)
Neutrophils: 69 %
Platelets: 196 x10E3/uL (ref 150–450)
RBC: 3.79 x10E6/uL (ref 3.77–5.28)
RDW: 13 % (ref 11.7–15.4)
RPR Ser Ql: NONREACTIVE
Rh Factor: NEGATIVE
Rubella Antibodies, IGG: <0.9 {index} — ABNORMAL LOW (ref >0.99)
Varicella zoster IgG: REACTIVE
WBC: 8.7 x10E3/uL (ref 3.4–10.8)

## 2024-07-07 LAB — MICROSCOPIC EXAMINATION
Casts: NONE SEEN /LPF
Epithelial Cells (non renal): >10 /HPF — AB (ref 0–10)
RBC, Urine: NONE SEEN /HPF (ref 0–2)

## 2024-07-07 LAB — HEMOGLOBIN A1C
Est. average glucose Bld gHb Est-mCnc: 103 mg/dL
Hgb A1c MFr Bld: 5.2 % (ref 4.8–5.6)

## 2024-07-07 LAB — HCV INTERPRETATION

## 2024-07-07 LAB — PROTEIN / CREATININE RATIO, URINE
Creatinine, Urine: 83.2 mg/dL
Protein, Ur: 9.2 mg/dL
Protein/Creat Ratio: 111 mg/g{creat} (ref 0–200)

## 2024-07-07 LAB — TSH+FREE T4
Free T4: 0.82 ng/dL (ref 0.82–1.77)
TSH: 1.21 u[IU]/mL (ref 0.450–4.500)

## 2024-07-08 LAB — MONITOR DRUG PROFILE 14(MW)
Amphetamine Scrn, Ur: NEGATIVE ng/mL
BARBITURATE SCREEN URINE: NEGATIVE ng/mL
BENZODIAZEPINE SCREEN, URINE: NEGATIVE ng/mL
Buprenorphine, Urine: NEGATIVE ng/mL
CANNABINOIDS UR QL SCN: NEGATIVE ng/mL
Cocaine (Metab) Scrn, Ur: NEGATIVE ng/mL
Creatinine(Crt), U: 85.6 mg/dL (ref 20.0–300.0)
Fentanyl, Urine: NEGATIVE pg/mL
Meperidine Screen, Urine: NEGATIVE ng/mL
Methadone Screen, Urine: NEGATIVE ng/mL
OXYCODONE+OXYMORPHONE UR QL SCN: NEGATIVE ng/mL
Opiate Scrn, Ur: NEGATIVE ng/mL
Ph of Urine: 6.1 (ref 4.5–8.9)
Phencyclidine Qn, Ur: NEGATIVE ng/mL
Propoxyphene Scrn, Ur: NEGATIVE ng/mL
SPECIFIC GRAVITY: 1.012
Tramadol Screen, Urine: NEGATIVE ng/mL

## 2024-07-08 LAB — NICOTINE SCREEN, URINE: Cotinine Ql Scrn, Ur: NEGATIVE ng/mL

## 2024-07-09 LAB — CYTOLOGY - PAP
Chlamydia: NEGATIVE
Comment: NEGATIVE
Comment: NORMAL
Diagnosis: NEGATIVE
Neisseria Gonorrhea: NEGATIVE

## 2024-07-11 LAB — PANORAMA PRENATAL TEST FULL PANEL:PANORAMA TEST PLUS 5 ADDITIONAL MICRODELETIONS: FETAL FRACTION: 6.6

## 2024-07-13 ENCOUNTER — Ambulatory Visit: Payer: Self-pay | Admitting: Certified Nurse Midwife

## 2024-07-24 ENCOUNTER — Encounter: Payer: Self-pay | Admitting: Emergency Medicine

## 2024-07-24 ENCOUNTER — Emergency Department
Admission: EM | Admit: 2024-07-24 | Discharge: 2024-07-24 | Disposition: A | Discharge status: Home / Self Care | Attending: Emergency Medicine | Admitting: Emergency Medicine

## 2024-07-24 ENCOUNTER — Other Ambulatory Visit: Payer: Self-pay

## 2024-07-24 DIAGNOSIS — R197 Diarrhea, unspecified: Secondary | ICD-10-CM | POA: Diagnosis not present

## 2024-07-24 DIAGNOSIS — Z3A15 15 weeks gestation of pregnancy: Secondary | ICD-10-CM | POA: Diagnosis not present

## 2024-07-24 DIAGNOSIS — O219 Vomiting of pregnancy, unspecified: Secondary | ICD-10-CM | POA: Insufficient documentation

## 2024-07-24 DIAGNOSIS — E860 Dehydration: Secondary | ICD-10-CM | POA: Diagnosis not present

## 2024-07-24 DIAGNOSIS — M545 Low back pain, unspecified: Secondary | ICD-10-CM

## 2024-07-24 DIAGNOSIS — R112 Nausea with vomiting, unspecified: Secondary | ICD-10-CM

## 2024-07-24 DIAGNOSIS — O99891 Other specified diseases and conditions complicating pregnancy: Secondary | ICD-10-CM | POA: Diagnosis not present

## 2024-07-24 LAB — COMPREHENSIVE METABOLIC PANEL WITH GFR
ALT: 23 U/L (ref 0–44)
AST: 24 U/L (ref 15–41)
Albumin: 3.1 g/dL — ABNORMAL LOW (ref 3.5–5.0)
Alkaline Phosphatase: 50 U/L (ref 38–126)
Anion gap: 12 (ref 5–15)
BUN: 5 mg/dL — ABNORMAL LOW (ref 6–20)
CO2: 19 mmol/L — ABNORMAL LOW (ref 22–32)
Calcium: 8.8 mg/dL — ABNORMAL LOW (ref 8.9–10.3)
Chloride: 104 mmol/L (ref 98–111)
Creatinine, Ser: 0.45 mg/dL (ref 0.44–1.00)
GFR, Estimated: >60 mL/min (ref >60)
Glucose, Bld: 97 mg/dL (ref 70–99)
Potassium: 3.1 mmol/L — ABNORMAL LOW (ref 3.5–5.1)
Sodium: 135 mmol/L (ref 135–145)
Total Bilirubin: 0.8 mg/dL (ref 0.0–1.2)
Total Protein: 7 g/dL (ref 6.5–8.1)

## 2024-07-24 LAB — CBC
HCT: 35.4 % — ABNORMAL LOW (ref 36.0–46.0)
Hemoglobin: 11.2 g/dL — ABNORMAL LOW (ref 12.0–15.0)
MCH: 29.6 pg (ref 26.0–34.0)
MCHC: 31.6 g/dL (ref 30.0–36.0)
MCV: 93.7 fL (ref 80.0–100.0)
Platelets: 163 K/uL (ref 150–400)
RBC: 3.78 MIL/uL — ABNORMAL LOW (ref 3.87–5.11)
RDW: 13.3 % (ref 11.5–15.5)
WBC: 8.1 K/uL (ref 4.0–10.5)
nRBC: 0 % (ref 0.0–0.2)

## 2024-07-24 LAB — LIPASE, BLOOD: Lipase: 14 U/L (ref 11–51)

## 2024-07-24 LAB — URINALYSIS, ROUTINE W REFLEX MICROSCOPIC
Bilirubin Urine: NEGATIVE
Glucose, UA: NEGATIVE mg/dL
Hgb urine dipstick: NEGATIVE
Ketones, ur: 80 mg/dL — AB
Leukocytes,Ua: NEGATIVE
Nitrite: NEGATIVE
Protein, ur: NEGATIVE mg/dL
Specific Gravity, Urine: 1.027 (ref 1.005–1.030)
pH: 5 (ref 5.0–8.0)

## 2024-07-24 LAB — RESP PANEL BY RT-PCR (RSV, FLU A&B, COVID)  RVPGX2
Influenza A by PCR: NEGATIVE
Influenza B by PCR: NEGATIVE
Resp Syncytial Virus by PCR: NEGATIVE
SARS Coronavirus 2 by RT PCR: NEGATIVE

## 2024-07-24 LAB — LACTIC ACID, PLASMA
Lactic Acid, Venous: 2.1 mmol/L (ref 0.5–1.9)
Lactic Acid, Venous: 0.8 mmol/L (ref 0.5–1.9)

## 2024-07-24 MED ORDER — ONDANSETRON HCL 4 MG/2ML IJ SOLN
4.0000 mg | Freq: Once | INTRAMUSCULAR | Status: AC
  Administered 2024-07-24: 4 mg via INTRAVENOUS
  Filled 2024-07-24: qty 2

## 2024-07-24 MED ORDER — ONDANSETRON 4 MG PO TBDP: 4.0000 mg | ORAL_TABLET | Freq: Three times a day (TID) | ORAL | 0 refills | Status: DC | PRN

## 2024-07-24 MED ORDER — LACTATED RINGERS IV BOLUS
1000.0000 mL | Freq: Once | INTRAVENOUS | Status: AC
  Administered 2024-07-24: 1000 mL via INTRAVENOUS

## 2024-07-24 MED ORDER — SODIUM CHLORIDE 0.9 % IV BOLUS
1000.0000 mL | Freq: Once | INTRAVENOUS | Status: AC
  Administered 2024-07-24: 1000 mL via INTRAVENOUS

## 2024-07-24 MED ORDER — ACETAMINOPHEN 500 MG PO TABS
1000.0000 mg | ORAL_TABLET | Freq: Once | ORAL | Status: AC
  Administered 2024-07-24: 1000 mg via ORAL
  Filled 2024-07-24: qty 2

## 2024-07-24 MED ORDER — POTASSIUM CHLORIDE CRYS ER 20 MEQ PO TBCR
40.0000 meq | EXTENDED_RELEASE_TABLET | Freq: Once | ORAL | Status: AC
Start: 2024-07-24 — End: 2024-07-24
  Administered 2024-07-24: 40 meq via ORAL
  Filled 2024-07-24: qty 2

## 2024-07-24 NOTE — Discharge Instructions (Addendum)
 Clear liquid diet tomorrow and progressed to bland foods as tolerated.  Follow-up with OB/GYN.  If symptoms change or worsen, return to the emergency department if you are unable to schedule an appointment.

## 2024-07-24 NOTE — ED Triage Notes (Signed)
 Patient to ED via POV for vomiting/diarrhea/body aches and abd pain. States fever of 100.4 at home. Started this AM. Currently [redacted] weeks pregnant.

## 2024-07-24 NOTE — ED Notes (Signed)
 Pt provided discharge instructions and prescription information. Pt was given the opportunity to ask questions and questions were answered.

## 2024-07-24 NOTE — ED Provider Notes (Signed)
-----------------------------------------   11:19 PM on 07/24/2024 -----------------------------------------  Blood pressure 120/77, pulse 96, temperature 98.2 F (36.8 C), temperature source Oral, resp. rate (!) 21, height 5' 4 (1.626 m), weight 88.5 kg, last menstrual period 04/11/2024, SpO2 96%.  Assuming care from Dr. Floy.  In short, Natasha Porter is a 28 y.o. female with a chief complaint of Emesis .  Refer to the original H&P for additional details.  The current plan of care is to await outstanding lab studies with likely plan of discharge.    Clinical Course as of 07/24/24 2319  Fri Jul 24, 2024  2019 Patient received in signout from Dr. Floy. Urinalysis overall reassuring with the exception of ketones of 80 which is likely secondary to dehydration.  Fluids are infusing. Lactic noted to be elevated to 2.1.  Also likely to dehydration.  Will repeat and get updated vital signs. [CT]  2145 Repeat lactate down to 0.8.  Patient denies nausea and has not vomited since signout.  She continues to complain of low back pain and reports radiation into the right leg intermittently.  Heart rate now 96.  Plan will be to discharge patient home with prescription for zofran  and have her follow up with OBGYN.  [CT]    Clinical Course User Index [CT] Natasha Kirk NOVAK, FNP      Natasha Kirk NOVAK, FNP 07/24/24 2320    Floy Roberts, MD 07/25/24 2320

## 2024-07-24 NOTE — ED Provider Notes (Signed)
 [facility] Provider Note    Event Date/Time   First MD Initiated Contact with Patient 07/24/24 TRENNA     (approximate)   History   Emesis   HPI  Natasha Porter is a 28 y.o. female is a [redacted] weeks pregnant who presents to the emergency department today because of concerns for vomiting, diarrhea, low back pain and abdominal pain.  She states that her symptoms started today.  She did have some vomiting early in the pregnancy but none recently.  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  Per chart review patient has history of appendectomy and cholecystectomy.     Physical Exam   Triage Vital Signs: ED Triage Vitals [07/24/24 1610]  Encounter Vitals Group     BP 128/80     Girls Systolic BP Percentile      Girls Diastolic BP Percentile      Boys Systolic BP Percentile      Boys Diastolic BP Percentile      Pulse Rate (!) 141     Resp 18     Temp 99.2 F (37.3 C)     Temp Source Oral     SpO2 99 %     Weight 195 lb (88.5 kg)     Height 5' 4 (1.626 m)     Head Circumference      Peak Flow      Pain Score 7     Pain Loc      Pain Education      Exclude from Growth Chart     Most recent vital signs: Vitals:   07/24/24 1610  BP: 128/80  Pulse: (!) 141  Resp: 18  Temp: 99.2 F (37.3 C)  SpO2: 99%   General: Awake, alert, oriented. CV:  Good peripheral perfusion. Tachycardia. Resp:  Normal effort. Lungs clear. Abd:  No distention. Non tender.  ED Results / Procedures / Treatments   Labs (all labs ordered are listed, but only abnormal results are displayed) Labs Reviewed  COMPREHENSIVE METABOLIC PANEL WITH GFR - Abnormal; Notable for the following components:      Result Value   Potassium 3.1 (*)    CO2 19 (*)    BUN 5 (*)    Calcium  8.8 (*)    Albumin 3.1 (*)    All other components within normal limits  CBC - Abnormal; Notable for the following components:   RBC 3.78 (*)     Hemoglobin 11.2 (*)    HCT 35.4 (*)    All other components within normal limits  RESP PANEL BY RT-PCR (RSV, FLU A&B, COVID)  RVPGX2  LIPASE, BLOOD  URINALYSIS, ROUTINE W REFLEX MICROSCOPIC  LACTIC ACID, PLASMA  LACTIC ACID, PLASMA  POC URINE PREG, ED     EKG  I, Guadalupe Eagles, attending physician, personally viewed and interpreted this EKG  EKG Time: 1618 Rate: 129 Rhythm: sinus tachycardia Axis: normal Intervals: qtc 454 QRS: narrow ST changes: no st elevation Impression: abnormal ekg   RADIOLOGY None   PROCEDURES:  Critical Care performed: No    MEDICATIONS ORDERED IN ED: Medications  sodium chloride  0.9 % bolus 1,000 mL (has no administration in time range)     IMPRESSION / MDM / ASSESSMENT AND PLAN / ED COURSE  I reviewed the triage vital signs and the nursing notes.  Differential diagnosis includes, but is not limited to, viral illness, UTI, dehydration, electrolyte abnormality  Patient's presentation is most consistent with acute presentation with potential threat to life or bodily function.   Patient presented to the emergency department today because of concerns for nausea vomiting diarrhea, body aches, abdominal pain that started this morning.  On exam patient is tachycardic.  No significant abdominal tenderness.  Bedside ultrasound shows good fetal movement and heart rate.  Blood work is concerning for slight hypokalemia which I think is secondary to poor po intake today.  Additionally patient's tachycardia likely secondary to dehydration.  COVID flu negative.  No concerning leukocytosis in the blood work.  Will check urine.  Will give IV fluids and antiemetics.  I do think patient's tachycardia resolves that she will likely be safe for discharge.  Awaiting UA at time of signout.  FINAL CLINICAL IMPRESSION(S) / ED DIAGNOSES   Final diagnoses:  Nausea vomiting and diarrhea     Note:  This document was prepared using  Dragon voice recognition software and may include unintentional dictation errors.    Floy Roberts, MD 07/24/24 [name]

## 2024-08-03 ENCOUNTER — Encounter: Admitting: Licensed Practical Nurse

## 2024-08-06 ENCOUNTER — Encounter: Payer: Self-pay | Admitting: Obstetrics

## 2024-08-06 ENCOUNTER — Ambulatory Visit: Admitting: Obstetrics

## 2024-08-06 VITALS — BP 108/65 | HR 83 | Wt 188.6 lb

## 2024-08-06 DIAGNOSIS — Z3A16 16 weeks gestation of pregnancy: Secondary | ICD-10-CM | POA: Diagnosis not present

## 2024-08-06 DIAGNOSIS — Z3480 Encounter for supervision of other normal pregnancy, unspecified trimester: Secondary | ICD-10-CM

## 2024-08-06 DIAGNOSIS — Z3482 Encounter for supervision of other normal pregnancy, second trimester: Secondary | ICD-10-CM

## 2024-08-06 NOTE — Progress Notes (Signed)
    Return Prenatal Note   Assessment/Plan   Plan  28 y.o. G2P1001 at [redacted]w[redacted]d presents for follow-up OB visit. Reviewed prenatal record including previous visit note.  Supervision of other normal pregnancy, antepartum -Declines AFP testing today.  -Discussed danger signs and when to go to the hospital.  -Anatomy scan on 08/31/24.  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  -Recent ED visit 07/24/24 for emesis, resolved now.  -Continue pre-natal vitamin   No orders of the defined types were placed in this encounter.  No follow-ups on file.   Future Appointments  Date Time Provider Department Center  08/31/2024  8:15 AM [facility] US  1 AOB-IMG None  09/03/2024 10:15 AM Natasha Porter, CNM [facility] None    For next visit:  Routine prenatal care    Subjective  Natasha Porter is a G2P1 here for routine OB visit. She has no complaints, is starting to feel the baby move. 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   Movement: Present Contractions: Not present  Objective   Flow sheet Vitals: Pulse Rate: 83 BP: 108/65 Fetal Heart Rate (bpm): 145 Total weight gain: 2.994 kg  General Appearance  No acute distress, well appearing, and well nourished Pulmonary   Normal work of breathing Neurologic   Alert and oriented to person, place, and time Psychiatric   Mood and affect within normal limits  Maryelizabeth Oar, [place]- Student 08/06/24 10:10 AM

## 2024-08-06 NOTE — Assessment & Plan Note (Signed)
-  Declines AFP testing today.  -Discussed danger signs and when to go to the hospital.  -Anatomy scan on 08/31/24.  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  -Recent ED visit 07/24/24 for emesis, resolved now.  -Continue pre-natal vitamin

## 2024-08-31 ENCOUNTER — Ambulatory Visit (INDEPENDENT_AMBULATORY_CARE_PROVIDER_SITE_OTHER)

## 2024-08-31 DIAGNOSIS — Z363 Encounter for antenatal screening for malformations: Secondary | ICD-10-CM | POA: Diagnosis not present

## 2024-08-31 DIAGNOSIS — Z3A20 20 weeks gestation of pregnancy: Secondary | ICD-10-CM
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
## 2024-09-03 ENCOUNTER — Ambulatory Visit (INDEPENDENT_AMBULATORY_CARE_PROVIDER_SITE_OTHER): Admitting: Advanced Practice Midwife

## 2024-09-03 ENCOUNTER — Encounter: Payer: Self-pay | Admitting: Advanced Practice Midwife

## 2024-09-03 VITALS — BP 116/74 | HR 104 | Wt 193.8 lb

## 2024-09-03 DIAGNOSIS — Z3A20 20 weeks gestation of pregnancy: Secondary | ICD-10-CM | POA: Diagnosis not present

## 2024-09-03 DIAGNOSIS — Z3482 Encounter for supervision of other normal pregnancy, second trimester: Secondary | ICD-10-CM

## 2024-10-05 NOTE — Progress Notes (Deleted)
    Return Prenatal Note   Subjective   28 y.o. G2P1001 at [redacted]w[redacted]d presents for this follow-up prenatal visit.  Patient *** Patient reports:    Objective   Flow sheet Vitals:   Total weight gain: 11 lb 12.8 oz (5.352 kg)  General Appearance  No acute distress, well appearing, and well nourished Pulmonary   Normal work of breathing Neurologic   Alert and oriented to person, place, and time Psychiatric   Mood and affect within normal limits   Assessment/Plan   Plan  28 y.o. G2P1001 at [redacted]w[redacted]d presents for follow-up OB visit. Reviewed prenatal record including previous visit note.  No problem-specific Assessment & Plan notes found for this encounter.      No orders of the defined types were placed in this encounter.  No follow-ups on file.   Future Appointments  Date Time Provider Department Center  10/06/2024  8:15 AM Slaughterbeck, Damien, CNM [facility] None    For next visit:  ROB with 1 hour glucola, third trimester labs, and Tdap       Damien Parsley, CNM [facility] 10/06/2511:04 PM

## 2024-10-06 ENCOUNTER — Encounter: Admitting: Certified Nurse Midwife

## 2024-10-14 NOTE — Patient Instructions (Signed)
 Tdap (Tetanus, Diphtheria, Pertussis) Vaccine: What You Need to Know Many vaccine information statements are available in Spanish and other languages. See promoage.com.br. 1. Why get vaccinated? Tdap vaccine can prevent tetanus, diphtheria, and pertussis. Diphtheria and pertussis spread from person to person. Tetanus enters the body through cuts or wounds. TETANUS (T) causes painful stiffening of the muscles. Tetanus can lead to serious health problems, including being unable to open the mouth, having trouble swallowing and breathing, or death. DIPHTHERIA (D) can lead to difficulty breathing, heart failure, paralysis, or death. PERTUSSIS (aP), also known as whooping cough, can cause uncontrollable, violent coughing that makes it hard to breathe, eat, or drink. Pertussis can be extremely serious especially in babies and young children, causing pneumonia, convulsions, brain damage, or death. In teens and adults, it can cause weight loss, loss of bladder control, passing out, and rib fractures from severe coughing. 2. Tdap vaccine Tdap is only for children 7 years and older, adolescents, and adults.  Adolescents should receive a single dose of Tdap, preferably at age 20 or 12 years. Pregnant people should get a dose of Tdap during every pregnancy, preferably during the early part of the third trimester, to help protect the newborn from pertussis. Infants are most at risk for severe, life-threatening complications from pertussis. Adults who have never received Tdap should get a dose of Tdap. Also, adults should receive a booster dose of either Tdap or Td (a different vaccine that protects against tetanus and diphtheria but not pertussis) every 10 years, or after 5 years in the case of a severe or dirty wound or burn. Tdap may be given at the same time as other vaccines. 3. Talk with your health care provider Tell your vaccine provider if the person getting the vaccine: Has had an allergic  reaction after a previous dose of any vaccine that protects against tetanus, diphtheria, or pertussis, or has any severe, life-threatening allergies Has had a coma, decreased level of consciousness, or prolonged seizures within 7 days after a previous dose of any pertussis vaccine (DTP, DTaP, or Tdap) Has seizures or another nervous system problem Has ever had Guillain-Barr Syndrome (also called GBS) Has had severe pain or swelling after a previous dose of any vaccine that protects against tetanus or diphtheria In some cases, your health care provider may decide to postpone Tdap vaccination until a future visit. People with minor illnesses, such as a cold, may be vaccinated. People who are moderately or severely ill should usually wait until they recover before getting Tdap vaccine.  Your health care provider can give you more information. 4. Risks of a vaccine reaction Pain, redness, or swelling where the shot was given, mild fever, headache, feeling tired, and nausea, vomiting, diarrhea, or stomachache sometimes happen after Tdap vaccination. People sometimes faint after medical procedures, including vaccination. Tell your provider if you feel dizzy or have vision changes or ringing in the ears.  As with any medicine, there is a very remote chance of a vaccine causing a severe allergic reaction, other serious injury, or death. 5. What if there is a serious problem? An allergic reaction could occur after the vaccinated person leaves the clinic. If you see signs of a severe allergic reaction (hives, swelling of the face and throat, difficulty breathing, a fast heartbeat, dizziness, or weakness), call 9-1-1 and get the person to the nearest hospital. For other signs that concern you, call your health care provider.  Adverse reactions should be reported to the Vaccine Adverse Event Reporting  System (VAERS). Your health care provider will usually file this report, or you can do it yourself. Visit the  VAERS website at www.vaers.lagents.no or call 414-083-3268. VAERS is only for reporting reactions, and VAERS staff members do not give medical advice. 6. The National Vaccine Injury Compensation Program The [employer] Vaccine Injury Compensation Program (VICP) is a federal program that was created to compensate people who may have been injured by certain vaccines. Claims regarding alleged injury or death due to vaccination have a time limit for filing, which may be as short as two years. Visit the VICP website at spiritualword.at or call (415)424-8362 to learn about the program and about filing a claim. 7. How can I learn more? Ask your health care provider. Call your local or state health department. Visit the website of the Food and Drug Administration (FDA) for vaccine package inserts and additional information at finderlist.no. Contact the Centers for Disease Control and Prevention (CDC): Call 947-296-6925 (1-800-CDC-INFO) or Visit CDC's website at piccapture.uy. Source: CDC Vaccine Information Statement Tdap (Tetanus, Diphtheria, Pertussis) Vaccine (06/03/2020) This same material is available at footballexhibition.com.br for no charge. This information is not intended to replace advice given to you by your health care provider. Make sure you discuss any questions you have with your health care provider. Document Revised: 01/30/2023 Document Reviewed: 11/30/2022 Elsevier Patient Education  2024 Else[employer] Inc.Second Trimester of Pregnancy  The second trimester of pregnancy is from week 14 through week 27. This is months 4 through 6 of pregnancy. During the second trimester: Morning sickness is less or has stopped. You may have more energy. You may feel hungry more often. At this time, your unborn baby is growing very fast. At the end of the sixth month, the unborn baby may be up to 12 inches long and weigh about 1 pounds. You will likely start to feel  the baby move between 16 and 20 weeks of pregnancy. Body changes during your second trimester Your body continues to change during this time. The changes usually go away after your baby is born. Physical changes You will gain more weight. Your belly will get bigger. You may begin to get stretch marks on your hips, belly, and breasts. Your breasts will keep growing and may hurt. You may get dark spots or blotches on your face. A dark line from your belly button to the pubic area may appear. This line is called linea nigra. Your hair may grow faster and get thicker. Health changes You may have headaches. You may have heartburn. You may pee more often. You may have swollen, bulging veins (varicose veins). You may have trouble pooping (constipation), or swollen veins in the butt that can itch or get painful (hemorrhoids). You may have back pain. This is caused by: Weight gain. Pregnancy hormones that are relaxing the joints in your pelvis. Follow these instructions at home: Medicines Talk to your health care provider if you're taking medicines. Ask if the medicines are safe to take during pregnancy. Your provider may change the medicines that you take. Do not take any medicines unless told to by your provider. Take a prenatal vitamin that has at least 600 micrograms (mcg) of folic acid. Do not use herbal medicines, illegal drugs, or medicines that are not approved by your provider. Eating and drinking While you're pregnant your body needs extra food for your growing baby. Talk with your provider about what to eat while pregnant. Activity Most women are able to exercise during pregnancy. Exercises may need to  change as your pregnancy goes on. Talk to your provider about your activities and exercise routines. Relieving pain and discomfort Wear a good, supportive bra if your breasts hurt. Rest with your legs raised if you have leg cramps or low back pain. Take warm sitz baths to soothe  pain from hemorrhoids. Use hemorrhoid cream if your provider says it's okay. Do not douche. Do not use tampons or scented pads. Do not use hot tubs, steam rooms, or saunas. Safety Wear your seatbelt at all times when you're in a car. Talk to your provider if someone hits you, hurts you, or yells at you. Talk with your provider if you're feeling sad or have thoughts of hurting yourself. Lifestyle Certain things can be harmful while you're pregnant. It's best to avoid the following: Do not drink alcohol,smoke, vape, or use products with nicotine  or tobacco in them. If you need help quitting, talk with your provider. Avoid cat litter boxes and soil used by cats. These things carry germs that can cause harm to your pregnancy and your baby. General instructions Keep all follow-up visits. It helps you and your unborn baby stay as healthy as possible. Write down your questions. Take them to your prenatal visits. Your provider will: Talk with you about your overall health. Give you advice or refer you to specialists who can help with different needs, including: Prenatal education classes. Mental health and counseling. Foods and healthy eating. Ask for help if you need help with food. Where to find more information American Pregnancy Association: americanpregnancy.org [employer]: acog.org Office on [employer] Health: travellesson.ca Contact a health care provider if: You have a headache that does not go away when you take medicine. You have any of these problems: You can't eat or drink. You throw up or feel like you may throw up. You have watery poop (diarrhea) for 2 days or more. You have pain when you pee or your pee smells bad. You have been sick for 2 days or more and are not getting better. Contact your provider right away if: You have any of these coming from your vagina: Abnormal discharge. Bad-smelling fluid. Bleeding. Your baby is moving less  than usual. You have contractions, belly cramping, or have pain in your pelvis or lower back. You have symptoms of high blood pressure or preeclampsia. These include: A severe, throbbing headache that does not go away. Sudden or extreme swelling of your face, hands, legs, or feet. Vision problems: You see spots. You have blurry vision. Your eyes are sensitive to light. If you can't reach the provider, go to an urgent care or emergency room. Get help right away if: You faint, become confused, or can't think clearly. You have chest pain or trouble breathing. You have any kind of injury, such as from a fall or a car crash. These symptoms may be an emergency. Call 911 right away. Do not wait to see if the symptoms will go away. Do not drive yourself to the hospital. This information is not intended to replace advice given to you by your health care provider. Make sure you discuss any questions you have with your health care provider. Document Revised: 07/18/2023 Document Reviewed: 02/15/2023 Elsevier Patient Education  2024 Elsevier Inc.Oral Glucose Tolerance Test During Pregnancy Why am I having this test? The oral glucose tolerance test (OGTT) is done to check how your body uses blood sugar, also called glucose. It's one of many tests used to diagnose the type of diabetes  you can get while pregnant. This type of diabetes is called gestational diabetes mellitus (GDM). You may get GDM during the middle part of your pregnancy. In most cases, it goes away after you give birth. Most people get tested for GDM around weeks 24-28 of pregnancy. You may have the test sooner if: You or your mother had diabetes while pregnant. A person in your family has diabetes. You're having more than one baby this pregnancy. You've had a baby before who weighed more than 9 pounds (4 kg) at birth. You have high blood pressure or heart disease. You have a large body. You're not active. What is being tested? This  test measures your blood sugar at different times. It shows how well your body uses the sugar in your blood. What kind of sample is taken?  A sample of blood is needed for this test. The sample is taken by putting a needle into a blood vessel. How do I prepare for this test? Eat your normal meals the day before the test. Your health care provider will tell you about: Eating or drinking on the day of the test. You may need to fast for 8-10 hours before the test. When you fast, you can only have water. Changing or stopping your regular medicines. Some medicines may affect your test results. Tell a health care provider about: All medicines you take. These include vitamins, herbs, eye drops, and creams. What happens during the test? The test involves these steps: Your blood sugar will be checked. It's called your fasting blood sugar if you fasted before the test. You'll drink a sugary mixture. Your blood sugar will be checked again. For a 1-hour test, it will be checked after an hour. For a 3-hour test, it will be checked 1, 2, and 3 hours after you drink the sugary mixture. The test takes 1-3 hours. You'll need to stay at the testing place during this time. During the testing time: Do not eat or drink anything after the sugary drink. Do not exercise. Do not use any products that contain nicotine  or tobacco. These products include cigarettes, chewing tobacco, and vaping devices, such as e-cigarettes. The test may vary among providers and hospitals. How are the results reported? Your provider will compare your results to normal values for the kind of test that you had done. You may need to call or meet with your provider to get your results. What do the results mean? Your provider can tell you what blood sugar levels are normal for the test you're doing. If two or more of your blood sugar levels are at or above normal, you may be diagnosed with GDM. If only one level is high, your provider may  suggest: Doing the test again. Doing other tests to confirm a diagnosis. Talk with your provider about what your results mean. Questions to ask your health care provider Ask your provider, or the department doing the test: When will my results be ready? How will I get my results? What are my next steps? This information is not intended to replace advice given to you by your health care provider. Make sure you discuss any questions you have with your health care provider. Document Revised: 02/18/2023 Document Reviewed: 02/18/2023 Elsevier Patient Education  2024 [employer].

## 2024-10-15 ENCOUNTER — Ambulatory Visit: Admitting: Registered Nurse

## 2024-10-15 VITALS — BP 120/80 | HR 104 | Wt 199.9 lb

## 2024-10-15 DIAGNOSIS — Z3A26 26 weeks gestation of pregnancy: Secondary | ICD-10-CM

## 2024-10-15 DIAGNOSIS — H539 Unspecified visual disturbance: Secondary | ICD-10-CM

## 2024-10-15 DIAGNOSIS — Z3482 Encounter for supervision of other normal pregnancy, second trimester: Secondary | ICD-10-CM

## 2024-10-15 NOTE — Progress Notes (Unsigned)
° ° °  Return Prenatal Note   Subjective   28 y.o. G2P1001 at [redacted]w[redacted]d presents for this follow-up prenatal visit.  Patient *** Patient reports: Movement: Present Contractions: Not present  Objective   Flow sheet Vitals: Pulse Rate: (!) 104 BP: 120/80 Total weight gain: 17 lb 14.4 oz (8.119 kg)  General Appearance  No acute distress, well appearing, and well nourished Pulmonary   Normal work of breathing Neurologic   Alert and oriented to person, place, and time Psychiatric   Mood and affect within normal limits   Assessment/Plan   Plan  28 y.o. G2P1001 at [redacted]w[redacted]d presents for follow-up OB visit. Reviewed prenatal record including previous visit note.  No problem-specific Assessment & Plan notes found for this encounter.      Orders Placed This Encounter  Procedures   28 Weeks RH-Panel    Standing Status:   Future    Expected Date:   10/29/2024    Expiration Date:   10/15/2025   Ambulatory referral to Ophthalmology    Referral Priority:   Routine    Referral Type:   Consultation    Referral Reason:   Specialty Services Required    Requested Specialty:   Ophthalmology    Number of Visits Requested:   1   Return in 2 weeks (on 10/29/2024) for Schedule 28 weeks with ROB .   Future Appointments  Date Time Provider Department Center  10/30/2024  8:20 AM [facility] None  10/30/2024  8:55 AM Lynda Bradley, CNM [facility] None     For next visit:  ROB with 1 hour glucola, third trimester labs, and Tdap       Damien Parsley, CNM [facility] 12/18/202510:31 AM

## 2024-10-28 NOTE — Patient Instructions (Signed)
 Rh0 [D] Immune Globulin  injection What is this medication? RhO [D] IMMUNE GLOBULIN  (i MYOON  GLOB yoo lin) is used to treat idiopathic thrombocytopenic purpura (ITP). This medicine is used in RhO negative mothers who are pregnant with a RhO positive child. It is also used after a transfusion of RhO positive blood into a RhO negative person. This medicine may be used for other purposes; ask your health care provider or pharmacist if you have questions. This medicine may be used for other purposes; ask your health care provider or pharmacist if you have questions. COMMON BRAND NAME(S): BayRho-D, HyperRHO S/D, MICRhoGAM, RhoGAM, Rhophylac , WinRho SDF What should I tell my care team before I take this medication? They need to know if you have any of these conditions: bleeding disorders low levels of immunoglobulin A in the body no spleen an unusual or allergic reaction to human immune globulin , other medicines, foods, dyes, or preservatives pregnant or trying to get pregnant breast-feeding How should I use this medication? This medicine is for injection into a muscle or into a vein. It is given by a health care professional in a hospital or clinic setting. Talk to your pediatrician regarding the use of this medicine in children. This medicine is not approved for use in children. Overdosage: If you think you have taken too much of this medicine contact a poison control center or emergency room at once. NOTE: This medicine is only for you. Do not share this medicine with others. Overdosage: If you think you have taken too much of this medicine contact a poison control center or emergency room at once. NOTE: This medicine is only for you. Do not share this medicine with others. What if I miss a dose? It is important not to miss your dose. Call your doctor or health care professional if you are unable to keep an appointment. What may interact with this medication? live virus vaccines, like measles,  mumps, or rubella This list may not describe all possible interactions. Give your health care provider a list of all the medicines, herbs, non-prescription drugs, or dietary supplements you use. Also tell them if you smoke, drink alcohol, or use illegal drugs. Some items may interact with your medicine. This list may not describe all possible interactions. Give your health care provider a list of all the medicines, herbs, non-prescription drugs, or dietary supplements you use. Also tell them if you smoke, drink alcohol, or use illegal drugs. Some items may interact with your medicine. What should I watch for while using this medication? This medicine is made from human blood. It may be possible to pass an infection in this medicine. Talk to your doctor about the risks and benefits of this medicine. This medicine may interfere with live virus vaccines. Before you get live virus vaccines tell your health care professional if you have received this medicine within the past 3 months. What side effects may I notice from receiving this medication? Side effects that you should report to your doctor or health care professional as soon as possible: allergic reactions like skin rash, itching or hives, swelling of the face, lips, or tongue breathing problems chest pain or tightness yellowing of the eyes or skin Side effects that usually do not require medical attention (report to your doctor or health care professional if they continue or are bothersome): fever pain and tenderness at site where injected This list may not describe all possible side effects. Call your doctor for medical advice about side effects. You may  report side effects to FDA at 1-800-FDA-1088. This list may not describe all possible side effects. Call your doctor for medical advice about side effects. You may report side effects to FDA at 1-800-FDA-1088. Where should I keep my medication? This drug is given in a hospital or clinic and  will not be stored at home. NOTE: This sheet is a summary. It may not cover all possible information. If you have questions about this medicine, talk to your doctor, pharmacist, or health care provider.  2024 Elsevier/Gold Standard (2008-07-31 00:00:00) Third Trimester of Pregnancy  The third trimester of pregnancy is from week 28 through week 40. This is months 7 through 9. The third trimester is a time when your baby is growing fast. Body changes during your third trimester Your body continues to change during this time. The changes usually go away after your baby is born. Physical changes You will continue to gain weight. You may get stretch marks on your hips, belly, and breasts. Your breasts will keep growing and may hurt. A yellow fluid (colostrum) may leak from your breasts. This is the first milk you're making for your baby. Your hair may grow faster and get thicker. In some cases, you may get hair loss. Your belly button may stick out. You may have more swelling in your hands, face, or ankles. Health changes You may have heartburn. You may feel short of breath. This is caused by the uterus that is now bigger. You may have more aches in the pelvis, back, or thighs. You may have more tingling or numbness in your hands, arms, and legs. You may pee more often. You may have trouble pooping (constipation) or swollen veins in the butt that can itch or get painful (hemorrhoids). Other changes You may have more problems sleeping. You may notice the baby moving lower in your belly (dropping). You may have more fluid coming from your vagina. Your joints may feel loose, and you may have pain around your pelvic bone. Follow these instructions at home: Medicines Take medicines only as told by your health care provider. Some medicines are not safe during pregnancy. Your provider may change the medicines that you take. Do not take any medicines unless told to by your provider. Take a  prenatal vitamin that has at least 600 micrograms (mcg) of folic acid. Do not use herbal medicines, illegal drugs, or medicines that are not approved by your provider. Eating and drinking While you're pregnant your body needs additional nutrition to help support your growing baby. Talk with your provider about your nutritional needs. Activity Most women are able to exercise regularly during pregnancy. Exercise routines may need to change at the end of your pregnancy. Talk to your provider about your activities and exercise routine. Relieving pain and discomfort Rest often with your legs raised if you have leg cramps or low back pain. Take warm sitz baths to soothe pain from hemorrhoids. Use hemorrhoid cream if your provider says it's okay. Wear a good, supportive bra if your breasts hurt. Do not use hot tubs, steam rooms, or saunas. Do not douche. Do not use tampons or scented pads. Safety Talk to your provider before traveling far distances. Wear your seatbelt at all times when you're in a car. Talk to your provider if someone hits you, hurts you, or yells at you. Preparing for birth To prepare for your baby: Take childbirth and breastfeeding classes. Visit the hospital and tour the maternity area. Buy a rear-facing car seat. Learn how  to install it in your car. General instructions Avoid cat litter boxes and soil used by cats. These things carry germs that can cause harm to your pregnancy and your baby. Do not drink alcohol, smoke, vape, or use products with nicotine  or tobacco in them. If you need help quitting, talk with your provider. Keep all follow-up visits for your third trimester. Your provider will do more exams and tests during this trimester. Write down your questions. Take them to your prenatal visits. Your provider also will: Talk with you about your overall health. Give you advice or refer you to specialists who can help with different needs, including: Mental health and  counseling. Foods and healthy eating. Ask for help if you need help with food. Where to find more information American Pregnancy Association: americanpregnancy.org [employer]ologists: acog.org Office on [employer] Health: [place] Contact a health care provider if: You have a headache that does not go away when you take medicine. You have any of these problems: You can't eat or drink. You have nausea and vomiting. You have watery poop (diarrhea) for 2 days or more. You have pain when you pee, or your pee smells bad. You have been sick for 2 days or more and aren't getting better. Contact your provider right away if: You have any of these coming from your vagina: Abnormal discharge. Bad-smelling fluid. Bleeding. Your baby is moving less than usual. You have signs of labor: You have any contractions, belly cramping, or have pain in your pelvis or lower back before 37 weeks of pregnancy (preterm labor). You have regular contractions that are less than 5 minutes apart. Your water breaks. You have symptoms of high blood pressure or preeclampsia. These include: A severe, throbbing headache that does not go away. Sudden or extreme swelling of your face, hands, legs, or feet. Vision problems: You see spots. You have blurry vision. Your eyes are sensitive to light. If you can't reach your provider, go to an urgent care or emergency room. Get help right away if: You faint, become confused, or can't think clearly. You have chest pain or trouble breathing. You have any kind of injury, such as from a fall or a car crash. These symptoms may be an emergency. Call 911 right away. Do not wait to see if the symptoms will go away. Do not drive yourself to the hospital. This information is not intended to replace advice given to you by your health care provider. Make sure you discuss any questions you have with your health care provider. Document Revised:  07/18/2023 Document Reviewed: 02/15/2023 Elsevier Patient Education  2024 [employer].

## 2024-10-30 ENCOUNTER — Ambulatory Visit: Admitting: Advanced Practice Midwife

## 2024-10-30 ENCOUNTER — Other Ambulatory Visit

## 2024-10-30 ENCOUNTER — Encounter: Payer: Self-pay | Admitting: Advanced Practice Midwife

## 2024-10-30 VITALS — BP 122/81 | HR 108 | Wt 202.0 lb

## 2024-10-30 DIAGNOSIS — O360130 Maternal care for anti-D [Rh] antibodies, third trimester, not applicable or unspecified: Secondary | ICD-10-CM | POA: Diagnosis not present

## 2024-10-30 DIAGNOSIS — Z3A28 28 weeks gestation of pregnancy: Secondary | ICD-10-CM

## 2024-10-30 DIAGNOSIS — Z6791 Unspecified blood type, Rh negative: Secondary | ICD-10-CM

## 2024-10-30 DIAGNOSIS — Z3482 Encounter for supervision of other normal pregnancy, second trimester: Secondary | ICD-10-CM

## 2024-10-30 DIAGNOSIS — Z3483 Encounter for supervision of other normal pregnancy, third trimester: Secondary | ICD-10-CM

## 2024-10-30 DIAGNOSIS — Z3480 Encounter for supervision of other normal pregnancy, unspecified trimester: Secondary | ICD-10-CM

## 2024-10-30 DIAGNOSIS — Z23 Encounter for immunization: Secondary | ICD-10-CM

## 2024-10-30 MED ORDER — RHO D IMMUNE GLOBULIN 1500 UNIT/2ML IJ SOSY
300.0000 ug | PREFILLED_SYRINGE | Freq: Once | INTRAMUSCULAR | Status: AC
  Administered 2024-10-30: 300 ug via INTRAMUSCULAR
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
## 2024-10-30 NOTE — Addendum Note (Signed)
 Addended by: DELANA SAUER on: 10/30/2024 10:54 AM   Modules accepted: Orders

## 2024-10-31 ENCOUNTER — Ambulatory Visit: Payer: Self-pay | Admitting: Registered Nurse

## 2024-10-31 DIAGNOSIS — O99810 Abnormal glucose complicating pregnancy: Secondary | ICD-10-CM

## 2024-10-31 LAB — 28 WEEKS RH-PANEL
Antibody Screen: NEGATIVE
Basophils Absolute: 0 x10E3/uL (ref 0.0–0.2)
Basos: 0 %
EOS (ABSOLUTE): 0.1 x10E3/uL (ref 0.0–0.4)
Eos: 1 %
Gestational Diabetes Screen: 146 mg/dL — ABNORMAL HIGH (ref 70–139)
HIV Screen 4th Generation wRfx: NONREACTIVE
Hematocrit: 29 % — ABNORMAL LOW (ref 34.0–46.6)
Hemoglobin: 9.4 g/dL — ABNORMAL LOW (ref 11.1–15.9)
Immature Grans (Abs): 0 x10E3/uL (ref 0.0–0.1)
Immature Granulocytes: 0 %
Lymphocytes Absolute: 1.2 x10E3/uL (ref 0.7–3.1)
Lymphs: 16 %
MCH: 28.9 pg (ref 26.6–33.0)
MCHC: 32.4 g/dL (ref 31.5–35.7)
MCV: 89 fL (ref 79–97)
Monocytes Absolute: 0.4 x10E3/uL (ref 0.1–0.9)
Monocytes: 5 %
Neutrophils Absolute: 5.6 x10E3/uL (ref 1.4–7.0)
Neutrophils: 78 %
Platelets: 152 x10E3/uL (ref 150–450)
RBC: 3.25 x10E6/uL — ABNORMAL LOW (ref 3.77–5.28)
RDW: 13.3 % (ref 11.7–15.4)
RPR Ser Ql: NONREACTIVE
WBC: 7.3 x10E3/uL (ref 3.4–10.8)

## 2024-11-02 NOTE — Telephone Encounter (Signed)
 Contacted the patient via phone. She is scheduled for 11/07/23 for 3 gtt lab.

## 2024-11-05 NOTE — Addendum Note (Signed)
 Addended by: TAFT CAMELIA MATSU on: 11/05/2024 04:55 PM   Modules accepted: Orders

## 2024-11-06 ENCOUNTER — Other Ambulatory Visit

## 2024-11-12 NOTE — Patient Instructions (Incomplete)
 Third Trimester of Pregnancy  The third trimester of pregnancy is from week 28 through week 40. This is months 7 through 9. The third trimester is a time when your baby is growing fast. Body changes during your third trimester Your body continues to change during this time. The changes usually go away after your baby is born. Physical changes You will continue to gain weight. You may get stretch marks on your hips, belly, and breasts. Your breasts will keep growing and may hurt. A yellow fluid (colostrum) may leak from your breasts. This is the first milk you're making for your baby. Your hair may grow faster and get thicker. In some cases, you may get hair loss. Your belly button may stick out. You may have more swelling in your hands, face, or ankles. Health changes You may have heartburn. You may feel short of breath. This is caused by the uterus that is now bigger. You may have more aches in the pelvis, back, or thighs. You may have more tingling or numbness in your hands, arms, and legs. You may pee more often. You may have trouble pooping (constipation) or swollen veins in the butt that can itch or get painful (hemorrhoids). Other changes You may have more problems sleeping. You may notice the baby moving lower in your belly (dropping). You may have more fluid coming from your vagina. Your joints may feel loose, and you may have pain around your pelvic bone. Follow these instructions at home: Medicines Take medicines only as told by your health care provider. Some medicines are not safe during pregnancy. Your provider may change the medicines that you take. Do not take any medicines unless told to by your provider. Take a prenatal vitamin that has at least 600 micrograms (mcg) of folic acid. Do not use herbal medicines, illegal drugs, or medicines that are not approved by your provider. Eating and drinking While you're pregnant your body needs additional nutrition to help  support your growing baby. Talk with your provider about your nutritional needs. Activity Most women are able to exercise regularly during pregnancy. Exercise routines may need to change at the end of your pregnancy. Talk to your provider about your activities and exercise routine. Relieving pain and discomfort Rest often with your legs raised if you have leg cramps or low back pain. Take warm sitz baths to soothe pain from hemorrhoids. Use hemorrhoid cream if your provider says it's okay. Wear a good, supportive bra if your breasts hurt. Do not use hot tubs, steam rooms, or saunas. Do not douche. Do not use tampons or scented pads. Safety Talk to your provider before traveling far distances. Wear your seatbelt at all times when you're in a car. Talk to your provider if someone hits you, hurts you, or yells at you. Preparing for birth To prepare for your baby: Take childbirth and breastfeeding classes. Visit the hospital and tour the maternity area. Buy a rear-facing car seat. Learn how to install it in your car. General instructions Avoid cat litter boxes and soil used by cats. These things carry germs that can cause harm to your pregnancy and your baby. Do not drink alcohol, smoke, vape, or use products with nicotine  or tobacco in them. If you need help quitting, talk with your provider. Keep all follow-up visits for your third trimester. Your provider will do more exams and tests during this trimester. Write down your questions. Take them to your prenatal visits. Your provider also will: Talk with you about  your overall health. Give you advice or refer you to specialists who can help with different needs, including: Mental health and counseling. Foods and healthy eating. Ask for help if you need help with food. Where to find more information American Pregnancy Association: americanpregnancy.org [employer]necologists: acog.org Office on [employer] Health:  [place] Contact a health care provider if: You have a headache that does not go away when you take medicine. You have any of these problems: You can't eat or drink. You have nausea and vomiting. You have watery poop (diarrhea) for 2 days or more. You have pain when you pee, or your pee smells bad. You have been sick for 2 days or more and aren't getting better. Contact your provider right away if: You have any of these coming from your vagina: Abnormal discharge. Bad-smelling fluid. Bleeding. Your baby is moving less than usual. You have signs of labor: You have any contractions, belly cramping, or have pain in your pelvis or lower back before 37 weeks of pregnancy (preterm labor). You have regular contractions that are less than 5 minutes apart. Your water breaks. You have symptoms of high blood pressure or preeclampsia. These include: A severe, throbbing headache that does not go away. Sudden or extreme swelling of your face, hands, legs, or feet. Vision problems: You see spots. You have blurry vision. Your eyes are sensitive to light. If you can't reach your provider, go to an urgent care or emergency room. Get help right away if: You faint, become confused, or can't think clearly. You have chest pain or trouble breathing. You have any kind of injury, such as from a fall or a car crash. These symptoms may be an emergency. Call 911 right away. Do not wait to see if the symptoms will go away. Do not drive yourself to the hospital. This information is not intended to replace advice given to you by your health care provider. Make sure you discuss any questions you have with your health care provider. Document Revised: 07/18/2023 Document Reviewed: 02/15/2023 Elsevier Patient Education  2024 Elsevier Inc. Darol Irving Contractions: Self-Care Braxton Hicks contractions may feel like labor contractions, but they're like practice for real labor. They can start when you're  halfway through your pregnancy. During the last 2 months of your pregnancy, these contractions may happen more and hurt more. However, they aren't a sign that you're in real labor. What are Darol Irving contractions? Braxton Hicks contractions make your belly muscles tighten. They aren't real labor because they don't make your cervix, which is the lowest part of your uterus, open and thin. This tightening of your belly before labor starts can also be called false labor. How to tell the difference between true labor and false labor True labor contractions Last 60-90 seconds. Happen on a pattern. Get stronger and last longer over time. Don't go away when you: Walk. Change positions. Rest. Discomfort often begins in the back and then moves to the front. The cervix opens and thins. False labor contractions Are weak and last a short time. Don't happen on a pattern. Happen farther apart than true labor. May go away when you: Walk. Change positions. Rest. May be noticed more at the end of the day. Discomfort is often only felt in the front of the belly. The cervix usually does not open or thin. Sometimes, the only way to tell if you're in true labor is for your health care provider to check your cervix. Your provider will do  a physical exam and may monitor your contractions. If you're in true labor, your provider may send you home with instructions about when to return to the hospital or may send you to the hospital right away. Follow these instructions at home:  Take your medicines only as told. Drink more fluids as told. Dehydration may cause these contractions. Dehydration is when there's not enough water in your body. If Braxton Hicks contractions are making you uncomfortable: Change your position or activity. Sit and rest in a tub of warm water. Do slow and deep breathing several times an hour. Keep all follow-up visits. Your provider will need to check your health and your baby's  health. Contact a health care provider if: Your contractions become: Stronger. More regular. Closer together. You have mucus from your vagina that has blood in it. Get help right away if: You feel your baby moving less than usual. You have any amount of fluid that flows from your vagina without stopping. You have signs or symptoms of labor before 37 weeks of pregnancy, such as: Contractions that are 5 minutes or less apart, or that get stronger and last longer. Sudden, sharp pain in your belly or lower back. These symptoms may be an emergency. Call 911 right away. Do not wait to see if the symptoms will go away. Do not drive yourself to the hospital. This information is not intended to replace advice given to you by your health care provider. Make sure you discuss any questions you have with your health care provider. Document Revised: 05/28/2023 Document Reviewed: 05/28/2023 Elsevier Patient Education  2025 [employer].

## 2024-11-13 ENCOUNTER — Encounter: Admitting: Advanced Practice Midwife

## 2024-11-13 DIAGNOSIS — Z3480 Encounter for supervision of other normal pregnancy, unspecified trimester: Secondary | ICD-10-CM

## 2024-11-17 NOTE — Telephone Encounter (Signed)
 Phone number on file is not in service.

## 2024-11-17 NOTE — Telephone Encounter (Signed)
 Contacted the patient via phone. She is scheduled for 1/28 for 3Gtt and ROB

## 2024-11-20 ENCOUNTER — Observation Stay
Admission: EM | Admit: 2024-11-20 | Discharge: 2024-11-20 | Disposition: A | Discharge status: Home / Self Care | Attending: Certified Nurse Midwife | Admitting: Certified Nurse Midwife

## 2024-11-20 ENCOUNTER — Other Ambulatory Visit: Payer: Self-pay

## 2024-11-20 ENCOUNTER — Telehealth: Payer: Self-pay

## 2024-11-20 DIAGNOSIS — Z3A31 31 weeks gestation of pregnancy: Secondary | ICD-10-CM | POA: Insufficient documentation

## 2024-11-20 DIAGNOSIS — O163 Unspecified maternal hypertension, third trimester: Secondary | ICD-10-CM | POA: Insufficient documentation

## 2024-11-20 DIAGNOSIS — Z3490 Encounter for supervision of normal pregnancy, unspecified, unspecified trimester: Secondary | ICD-10-CM

## 2024-11-20 DIAGNOSIS — O1203 Gestational edema, third trimester: Secondary | ICD-10-CM | POA: Diagnosis present

## 2024-11-20 DIAGNOSIS — Z6791 Unspecified blood type, Rh negative: Principal | ICD-10-CM

## 2024-11-20 DIAGNOSIS — Z3480 Encounter for supervision of other normal pregnancy, unspecified trimester: Secondary | ICD-10-CM

## 2024-11-20 NOTE — OB Triage Note (Signed)
 Patient discharged home in stable condition by provider. Pt reports no pain. Discharge instructions given. Patient verbalized understanding, no questions or concerns. Pt discharged in personal vehicle with support person. Labor precautions given.  Lyzbeth Genrich L. Pheonix Wisby, RN BSN 11/20/2024 9:51 PM

## 2024-11-20 NOTE — OB Triage Note (Signed)
 LABOR & DELIVERY OB TRIAGE NOTE  SUBJECTIVE  HPI Natasha Porter is a 29 y.o. G2P1001 at [redacted]w[redacted]d who presents to Labor & Delivery for evaluation of bilateral lower leg edema. She reports some elevated blood pressures on a new blood pressure cuff obtained today.   She endorses some back pain. Denies headache, vision changes or RUQ pain.   OB History     Gravida  2   Para  1   Term  1   Preterm      AB      Living  1      SAB      IAB      Ectopic      Multiple  0   Live Births  1           Scheduled Meds: Continuous Infusions: PRN Meds:.  OBJECTIVE  BP 128/69   Pulse 89   Temp 98.4 F (36.9 C) (Oral)   Resp 17   Ht 5' 4 (1.626 m)   Wt 90.7 kg   LMP 04/11/2024 (Approximate)   BMI 34.33 kg/m   General: A&O x4 Bilateral edema in lower extremities to knees- nonpitting.    NST I reviewed the NST and it was reactive.  Baseline: 135 Variability: moderate Accelerations: 15 x15 Decelerations:none Toco: none Category 1  ASSESSMENT Impression  1) Pregnancy at G2P1001, [redacted]w[redacted]d, Estimated Date of Delivery: 01/16/25 2) Reassuring maternal/fetal status 3)Three normal blood pressure readings in triage.   PLAN 1)Discharge home with precautions. Advised to bring blood pressure cuff to next appointment to ensure it is accurate.   Damien PARSLEY, CNM  11/20/24  9:48 PM

## 2024-11-20 NOTE — Telephone Encounter (Signed)
 Patient calling triage with concerns of swelling at the bottom of both legs. States her left ankle is more swollen. Denies headaches, feeling light headed. Has noticed for the last month one of her eyes goes black for a few seconds every now and then. 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 No swelling in hands or face.  Per Lauraine Lakes, CNM:  Needs to hydrate, lay down and rest on her side. She's really anemic. I expect that the vision going black every now and then is probably with a position change. This should improve with the anemia being corrected and staying hydrated. If she has redness or pain in one of her calves, she needs to go to the ER.  Patient aware.

## 2024-11-20 NOTE — OB Triage Note (Signed)
 Natasha Porter [place] 28 y.o. @[redacted]w[redacted]d  G2P1 presents to Labor & Delivery triage via wheelchair steered by ED staff reporting bilateral leg swelling below the knee for the past couple of weeks and left ankle swelling today. She also reports taking her blood pressure at home with readings 160/98,150/90, and 140/90. She denies signs and symptoms consistent with rupture of membranes or active vaginal bleeding. She denies contractions and states positive fetal movement. External FM and TOCO applied to non-tender abdomen. Initial FHR 135. Vital signs obtained and within normal limits. BP 127/78, cycling q15 mins. Patient oriented to care environment including call bell and bed control use. [place], CNM notified of patient's arrival. Lazara Grieser L. Jeylin Woodmansee, RN BSN 11/20/2024 9:19 PM

## 2024-11-24 DIAGNOSIS — Z3490 Encounter for supervision of normal pregnancy, unspecified, unspecified trimester: Secondary | ICD-10-CM | POA: Insufficient documentation

## 2024-11-24 NOTE — Progress Notes (Unsigned)
" ° ° °  Return Prenatal Note   Subjective   29 y.o. G2P1001 at [redacted]w[redacted]d presents for this follow-up prenatal visit. She was seen in triage on 1/23 for blood pressure check as she'd had some high readings at home. She was encouraged to bring her cuff with her today to make sure it correlates with our readings here, but she did not bring it. She's not checking them regularly. She is having her 3 hr GTT today. Her 1 hr was 146. Patient *** Patient reports: Movement: Present Contractions: Not present  Objective   Flow sheet Vitals: Pulse Rate: 97 BP: 125/85 Total weight gain: 22 lb 11.2 oz (10.3 kg)  General Appearance  No acute distress, well appearing, and well nourished Pulmonary   Normal work of breathing Neurologic   Alert and oriented to person, place, and time Psychiatric   Mood and affect within normal limits   Assessment/Plan   Plan  29 y.o. G2P1001 at [redacted]w[redacted]d presents for follow-up OB visit. Reviewed prenatal record including previous visit note.  Anemia affecting pregnancy in third trimester CBC and iron  indices today.  May need iron  infusion  Visual changes Not associated with elevated blood pressures. Not associated with headaches. Declines Optho referral today. Has also declined in the past.       No orders of the defined types were placed in this encounter.  Return in about 1 week (around 12/02/2024).   Future Appointments  Date Time Provider Department Center  11/25/2024  8:40 AM [facility]-OBGYN LAB [facility] None    For next visit:  RTC 1 wk for short interval f/u   Lauraine Lakes, [facility]  11/25/24 8:38 AM  "

## 2024-11-25 ENCOUNTER — Ambulatory Visit: Admitting: Registered Nurse

## 2024-11-25 ENCOUNTER — Other Ambulatory Visit

## 2024-11-25 VITALS — BP 125/85 | HR 97 | Wt 204.7 lb

## 2024-11-25 DIAGNOSIS — Z3A32 32 weeks gestation of pregnancy: Secondary | ICD-10-CM | POA: Diagnosis not present

## 2024-11-25 DIAGNOSIS — D649 Anemia, unspecified: Secondary | ICD-10-CM | POA: Diagnosis not present

## 2024-11-25 DIAGNOSIS — H539 Unspecified visual disturbance: Secondary | ICD-10-CM | POA: Diagnosis not present

## 2024-11-25 DIAGNOSIS — O99013 Anemia complicating pregnancy, third trimester: Secondary | ICD-10-CM | POA: Diagnosis not present

## 2024-11-25 DIAGNOSIS — O99810 Abnormal glucose complicating pregnancy: Secondary | ICD-10-CM

## 2024-11-25 DIAGNOSIS — O99891 Other specified diseases and conditions complicating pregnancy: Secondary | ICD-10-CM

## 2024-11-25 DIAGNOSIS — Z3483 Encounter for supervision of other normal pregnancy, third trimester: Secondary | ICD-10-CM

## 2024-11-25 NOTE — Progress Notes (Signed)
" ° ° °  Return Prenatal Note   Subjective   29 y.o. G2P1001 at [redacted]w[redacted]d presents for this follow-up prenatal visit. She was seen in triage on 1/23 for blood pressure check as she'd had some high readings at home. She was encouraged to bring her cuff with her today to make sure it correlates with our readings here, but she did not bring it. She's not checking them regularly. She is having her 3 hr GTT today. Her 1 hr was 146. Patient reports visual changes in her left eye, she describes that her left eye will turn static halfway down lasting around 20 seconds. She describes this happen 1 to 2 x a day. She has not associated these visual changes with headaches and reports them happening randomly throughout the day. She has not correlated a time in which they happen more frequently or any triggers that may cause them.  Patient reports: Movement: Present Contractions: Not present  Objective   Flow sheet Vitals: Pulse Rate: 97 BP: 125/85 Fundal Height: 33 cm Fetal Heart Rate (bpm): 129 Total weight gain: 10.3 kg  General Appearance  No acute distress, well appearing, and well nourished Pulmonary   Normal work of breathing Neurologic   Alert and oriented to person, place, and time Psychiatric   Mood and affect within normal limits   Assessment/Plan   Plan  29 y.o. G2P1001 at [redacted]w[redacted]d presents for follow-up OB visit. Reviewed prenatal record including previous visit note.  Anemia affecting pregnancy in third trimester CBC and iron  indices today.  May need iron  infusion  Visual changes Not associated with elevated blood pressures. Not associated with headaches. Declines Optho referral today. Has also declined in the past.   Supervision of normal pregnancy - Anticipatory guidance reviewed.  - Follow up in one week to discuss 3 hr GTT results along with BP check.  - Reviewed kick counts and preterm labor warning signs. Instructed to call office or come to hospital with persistent headache,  vision changes, regular contractions, leaking of fluid, decreased fetal movement or vaginal bleeding.        Orders Placed This Encounter  Procedures   CBC    Standing Status:   Future    Expected Date:   11/25/2024    Expiration Date:   02/23/2025   Fe+TIBC+Fer    Standing Status:   Future    Expected Date:   11/25/2024    Expiration Date:   02/23/2025   B12    Standing Status:   Future    Expected Date:   11/25/2024    Expiration Date:   02/23/2025   Return in about 1 week (around 12/02/2024).   No future appointments.   For next visit:  RTC 1 wk for short interval f/u   Rosina Hamilton, Student-MidWife  11/25/24 8:49 AM  "

## 2024-11-25 NOTE — Assessment & Plan Note (Addendum)
-   Anticipatory guidance reviewed.  - Follow up in one week to discuss 3 hr GTT results along with BP check.  - Reviewed kick counts and preterm labor warning signs. Instructed to call office or come to hospital with persistent headache, vision changes, regular contractions, leaking of fluid, decreased fetal movement or vaginal bleeding.

## 2024-11-25 NOTE — Assessment & Plan Note (Signed)
 CBC and iron  indices today.  May need iron  infusion

## 2024-11-25 NOTE — Assessment & Plan Note (Signed)
 Not associated with elevated blood pressures. Not associated with headaches. Declines Optho referral today. Has also declined in the past.

## 2024-11-25 NOTE — Patient Instructions (Signed)
 Third Trimester of Pregnancy  The third trimester of pregnancy is from week 28 through week 40. This is months 7 through 9. The third trimester is a time when your baby is growing fast. Body changes during your third trimester Your body continues to change during this time. The changes usually go away after your baby is born. Physical changes You will continue to gain weight. You may get stretch marks on your hips, belly, and breasts. Your breasts will keep growing and may hurt. A yellow fluid (colostrum) may leak from your breasts. This is the first milk you're making for your baby. Your hair may grow faster and get thicker. In some cases, you may get hair loss. Your belly button may stick out. You may have more swelling in your hands, face, or ankles. Health changes You may have heartburn. You may feel short of breath. This is caused by the uterus that is now bigger. You may have more aches in the pelvis, back, or thighs. You may have more tingling or numbness in your hands, arms, and legs. You may pee more often. You may have trouble pooping (constipation) or swollen veins in the butt that can itch or get painful (hemorrhoids). Other changes You may have more problems sleeping. You may notice the baby moving lower in your belly (dropping). You may have more fluid coming from your vagina. Your joints may feel loose, and you may have pain around your pelvic bone. Follow these instructions at home: Medicines Take medicines only as told by your health care provider. Some medicines are not safe during pregnancy. Your provider may change the medicines that you take. Do not take any medicines unless told to by your provider. Take a prenatal vitamin that has at least 600 micrograms (mcg) of folic acid. Do not use herbal medicines, illegal drugs, or medicines that are not approved by your provider. Eating and drinking While you're pregnant your body needs additional nutrition to help  support your growing baby. Talk with your provider about your nutritional needs. Activity Most women are able to exercise regularly during pregnancy. Exercise routines may need to change at the end of your pregnancy. Talk to your provider about your activities and exercise routine. Relieving pain and discomfort Rest often with your legs raised if you have leg cramps or low back pain. Take warm sitz baths to soothe pain from hemorrhoids. Use hemorrhoid cream if your provider says it's okay. Wear a good, supportive bra if your breasts hurt. Do not use hot tubs, steam rooms, or saunas. Do not douche. Do not use tampons or scented pads. Safety Talk to your provider before traveling far distances. Wear your seatbelt at all times when you're in a car. Talk to your provider if someone hits you, hurts you, or yells at you. Preparing for birth To prepare for your baby: Take childbirth and breastfeeding classes. Visit the hospital and tour the maternity area. Buy a rear-facing car seat. Learn how to install it in your car. General instructions Avoid cat litter boxes and soil used by cats. These things carry germs that can cause harm to your pregnancy and your baby. Do not drink alcohol, smoke, vape, or use products with nicotine or tobacco in them. If you need help quitting, talk with your provider. Keep all follow-up visits for your third trimester. Your provider will do more exams and tests during this trimester. Write down your questions. Take them to your prenatal visits. Your provider also will: Talk with you about  your overall health. Give you advice or refer you to specialists who can help with different needs, including: Mental health and counseling. Foods and healthy eating. Ask for help if you need help with food. Where to find more information American Pregnancy Association: americanpregnancy.org [employer]: acog.org Office on [employer] Health:  [employer] Contact a health care provider if: You have a headache that does not go away when you take medicine. You have any of these problems: You can't eat or drink. You have nausea and vomiting. You have watery poop (diarrhea) for 2 days or more. You have pain when you pee, or your pee smells bad. You have been sick for 2 days or more and aren't getting better. Contact your provider right away if: You have any of these coming from your vagina: Abnormal discharge. Bad-smelling fluid. Bleeding. Your baby is moving less than usual. You have signs of labor: You have any contractions, belly cramping, or have pain in your pelvis or lower back before 37 weeks of pregnancy (preterm labor). You have regular contractions that are less than 5 minutes apart. Your water breaks. You have symptoms of high blood pressure or preeclampsia. These include: A severe, throbbing headache that does not go away. Sudden or extreme swelling of your face, hands, legs, or feet. Vision problems: You see spots. You have blurry vision. Your eyes are sensitive to light. If you can't reach your provider, go to an urgent care or emergency room. Get help right away if: You faint, become confused, or can't think clearly. You have chest pain or trouble breathing. You have any kind of injury, such as from a fall or a car crash. These symptoms may be an emergency. Call 911 right away. Do not wait to see if the symptoms will go away. Do not drive yourself to the hospital. This information is not intended to replace advice given to you by your health care provider. Make sure you discuss any questions you have with your health care provider. Document Revised: 07/18/2023 Document Reviewed: 02/15/2023 Elsevier Patient Education  2024 [employer].

## 2024-11-26 ENCOUNTER — Ambulatory Visit: Payer: Self-pay | Admitting: Certified Nurse Midwife

## 2024-11-26 ENCOUNTER — Other Ambulatory Visit: Payer: Self-pay | Admitting: Certified Nurse Midwife

## 2024-11-26 LAB — GESTATIONAL GLUCOSE TOLERANCE
Glucose, Fasting: 82 mg/dL (ref 70–94)
Glucose, GTT - 1 Hour: 183 mg/dL — ABNORMAL HIGH (ref 70–179)
Glucose, GTT - 2 Hour: 165 mg/dL — ABNORMAL HIGH (ref 70–154)
Glucose, GTT - 3 Hour: 89 mg/dL (ref 70–139)

## 2024-11-26 MED ORDER — BLOOD GLUCOSE TEST VI STRP: 1.0000 | ORAL_STRIP | Freq: Four times a day (QID) | 3 refills | Status: AC

## 2024-11-26 MED ORDER — LANCETS MISC: 1.0000 | 0 refills | Status: AC

## 2024-11-26 MED ORDER — LANCET DEVICE MISC: 1.0000 | Freq: Four times a day (QID) | 0 refills | Status: AC

## 2024-11-26 MED ORDER — BLOOD GLUCOSE MONITORING SUPPL DEVI: 1.0000 | Freq: Four times a day (QID) | 0 refills | Status: AC

## 2024-12-02 ENCOUNTER — Ambulatory Visit: Admitting: Obstetrics

## 2024-12-02 ENCOUNTER — Encounter: Payer: Self-pay | Admitting: Obstetrics

## 2024-12-02 VITALS — BP 118/76 | HR 98 | Wt 204.9 lb

## 2024-12-02 DIAGNOSIS — O0990 Supervision of high risk pregnancy, unspecified, unspecified trimester: Secondary | ICD-10-CM

## 2024-12-02 DIAGNOSIS — O99013 Anemia complicating pregnancy, third trimester: Secondary | ICD-10-CM

## 2024-12-02 DIAGNOSIS — O24410 Gestational diabetes mellitus in pregnancy, diet controlled: Secondary | ICD-10-CM | POA: Insufficient documentation

## 2024-12-02 NOTE — Progress Notes (Addendum)
" ° ° °  Return Prenatal Note   Subjective  29 y.o. G2P1001 at [redacted]w[redacted]d presents for this follow-up prenatal visit.   Patient failed 3hGTT, no hx of prior GDM or pre-DM. Received supplies, has been checking sugars since 11/30/24. Also cut out sugary foods and beverages. Has not been contacted or scheduled for Lifestyles consult.   Date Fasting Breakfast Lunch Dinner  02/02 74 [phone]  02/03 73 77 99 106  02/04 79       Patient reports: Movement: Present Contractions: Not present Denies vaginal bleeding or leaking fluid. Objective  Flow sheet Vitals: Pulse Rate: 98 BP: 118/76 Fundal Height: 34 cm Fetal Heart Rate (bpm): 148 Total weight gain: 22 lb 14.4 oz (10.4 kg)   General Appearance  No acute distress, well appearing, and well nourished Pulmonary   Normal work of breathing Neurologic   Alert and oriented to person, place, and time Psychiatric   Mood and affect within normal limits  Assessment/Plan   Plan  29 y.o. G2P1001 at [redacted]w[redacted]d presents for follow-up OB visit. Reviewed prenatal record including previous visit note.  1. Supervision of high risk pregnancy, antepartum (Primary) -Declines RSV  2. Diet controlled gestational diabetes mellitus (GDM) in third trimester -Glucose log for a few days is well-controlled, continue QID checks -Lifestyles referral sent today; encouraged lean proteins and carbs coming from fresh fruits and veggies -Walk for 10 minutes after each meal -Discussed adverse maternal and fetal outcomes from uncontrolled glucose, pt seems motivated to diet-control -Needs serial growth US  Q4wks, ordered for next avail  3. Anemia affecting pregnancy in third trimester -Continue daily iron  -Check CBC at next visit  Orders Placed This Encounter  Procedures   US  OB Follow Up    Standing Status:   Future    Expected Date:   12/03/2024    Expiration Date:   12/02/2025    Reason for exam::   Growth and AFI for A1GDM    Preferred imaging location?:   Internal    Referral to Nutrition and Diabetes Services    Referral Priority:   Urgent    Referral Type:   Consultation    Referral Reason:   Specialty Services Required    Number of Visits Requested:   1   Return for Next avail growth US ; ROB 2wks.   Future Appointments  Date Time Provider Department Center  12/08/2024  9:30 AM AOB-AOB US  1 AOB-IMG None  12/16/2024  8:15 AM Natasha Porter, CNM AOB-AOB None    For next visit:  continue with routine prenatal care   Natasha Mangle, Natasha Porter [facility]  "

## 2024-12-08 ENCOUNTER — Other Ambulatory Visit

## 2024-12-16 ENCOUNTER — Encounter: Admitting: Certified Nurse Midwife
